# Patient Record
Sex: Female | Born: 1975 | Hispanic: Yes | Marital: Single | Smoking: Never smoker
Health system: Southern US, Community
[De-identification: ages and names within clinical notes are randomized; demographics above are authoritative.]

## PROBLEM LIST (undated history)

## (undated) DIAGNOSIS — E119 Type 2 diabetes mellitus without complications: Secondary | ICD-10-CM

## (undated) DIAGNOSIS — A6000 Herpesviral infection of urogenital system, unspecified: Secondary | ICD-10-CM

## (undated) DIAGNOSIS — O09529 Supervision of elderly multigravida, unspecified trimester: Secondary | ICD-10-CM

## (undated) DIAGNOSIS — N879 Dysplasia of cervix uteri, unspecified: Secondary | ICD-10-CM

## (undated) HISTORY — PX: LEEP: SHX91

---

## 2008-03-07 ENCOUNTER — Inpatient Hospital Stay (HOSPITAL_COMMUNITY): Admission: AD | Admit: 2008-03-07 | Discharge: 2008-03-08 | Payer: Self-pay | Admitting: Obstetrics & Gynecology

## 2008-03-22 ENCOUNTER — Inpatient Hospital Stay (HOSPITAL_COMMUNITY): Admission: RE | Admit: 2008-03-22 | Discharge: 2008-03-22 | Payer: Self-pay | Admitting: Obstetrics & Gynecology

## 2008-11-11 DIAGNOSIS — O321XX0 Maternal care for breech presentation, not applicable or unspecified: Secondary | ICD-10-CM

## 2009-09-30 IMAGING — US US OB COMP LESS 14 WK
1 series · 14 of 28 positions shown · non-contrast
Comparison: none

CLINICAL DATA: Early pregnancy.  Pelvic pain.

OBSTETRIC <14 WK US AND TRANSVAGINAL OB US
TECHNIQUE: Both transabdominal and transvaginal ultrasound
examinations were performed for complete evaluation of the
gestation as well as the maternal uterus, adnexal regions, and
pelvic cul-de-sac.

[Series 1: us ob comp less 14 wk · 0.28mm/px · 14 of 49 slices shown]
[im 2/49]
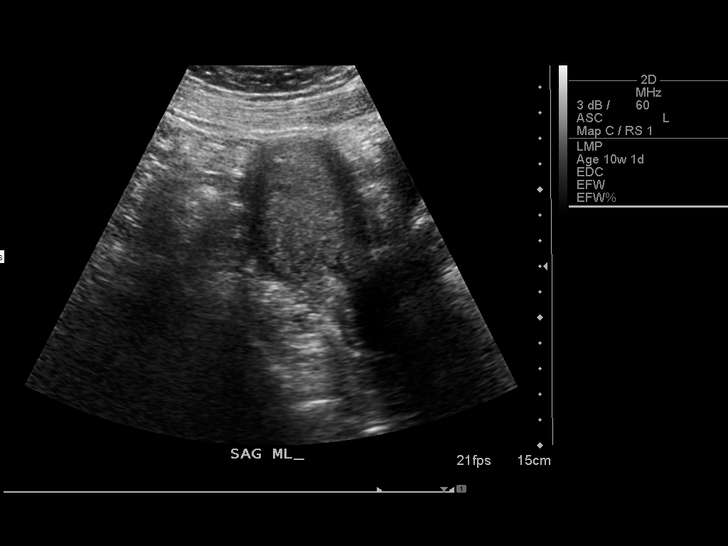
[im 6/49]
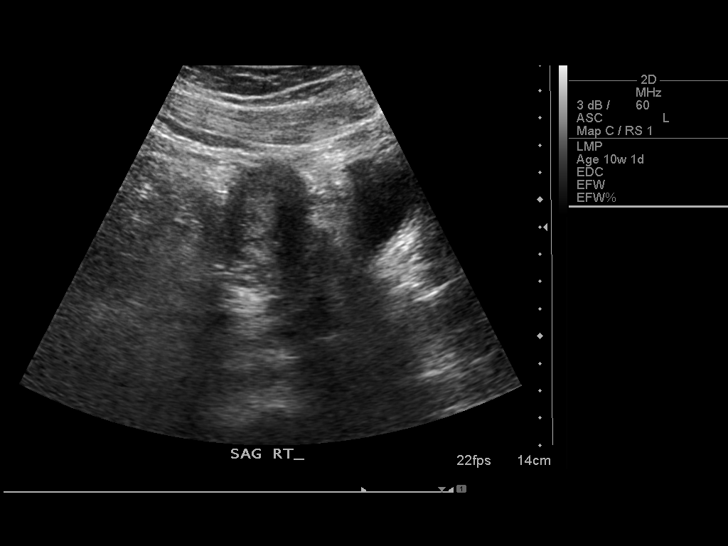
[im 9/49]
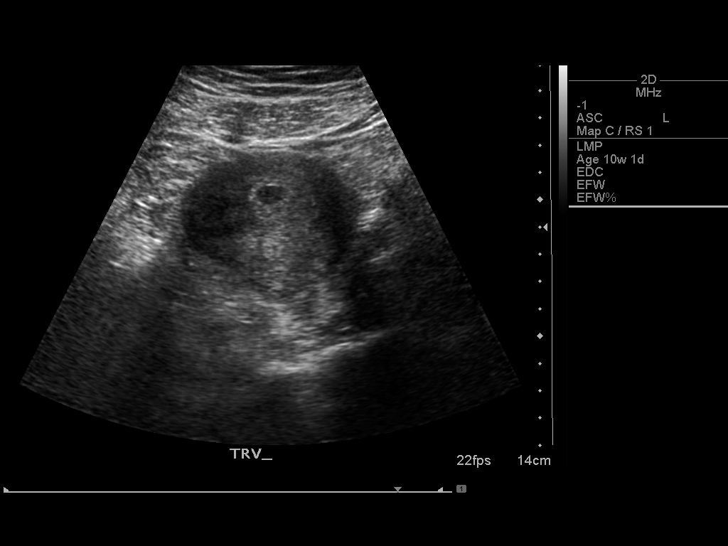
[im 13/49]
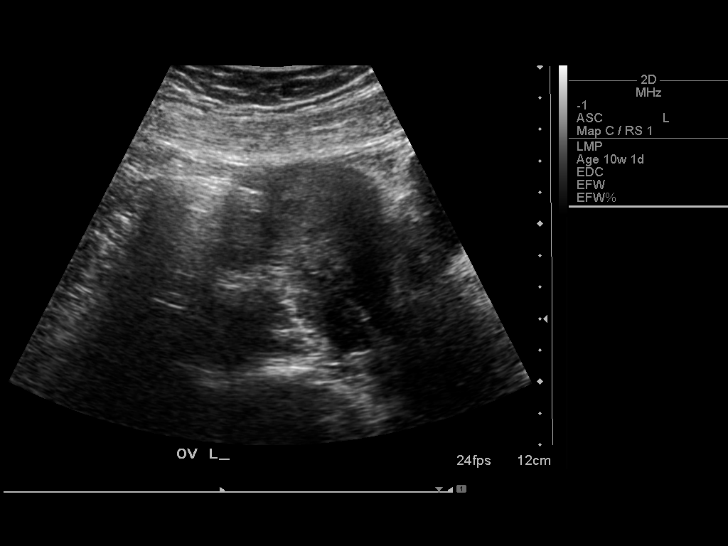
[im 17/49]
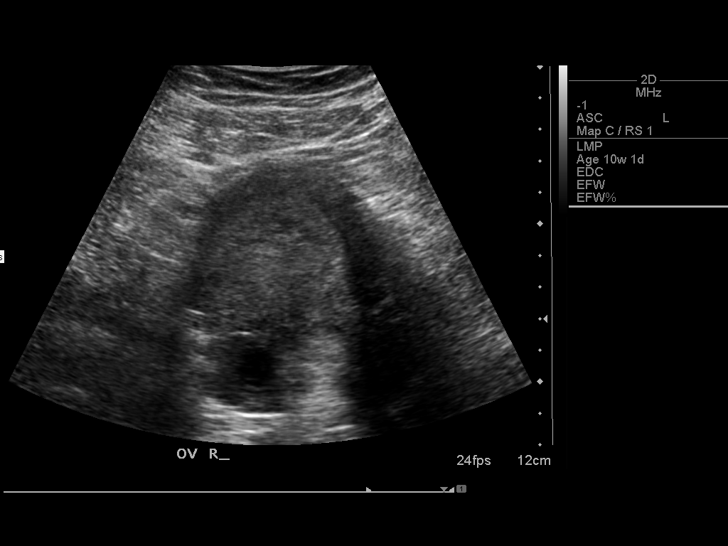
[im 20/49]
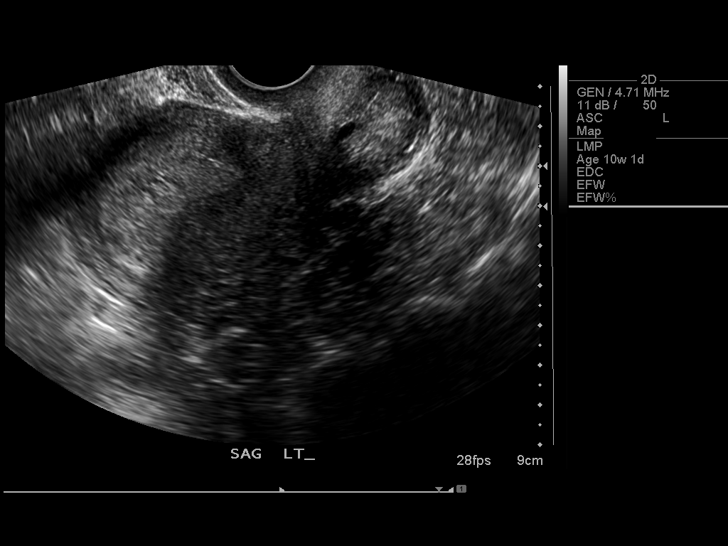
[im 24/49]
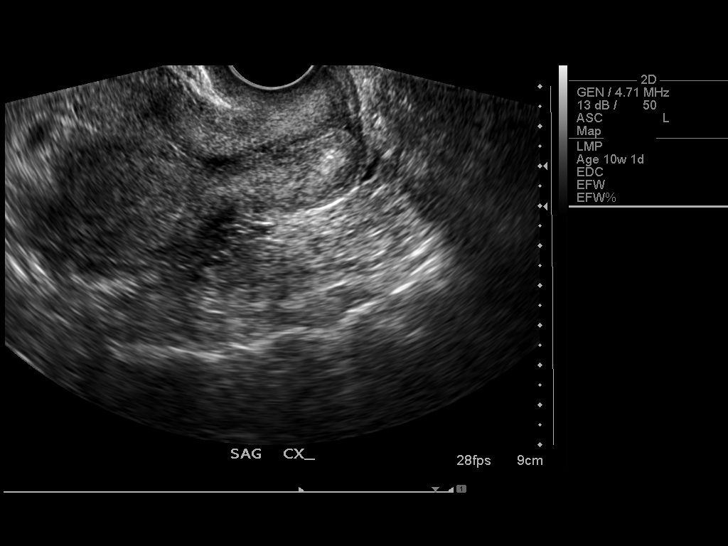
[im 27/49]
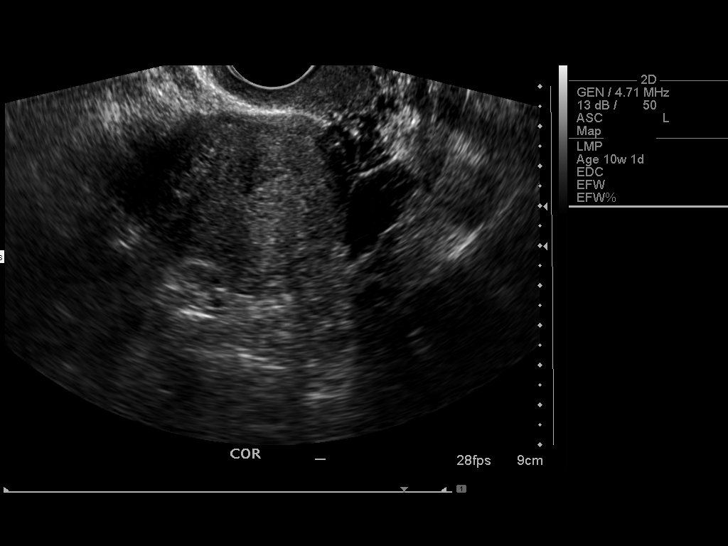
[im 31/49]
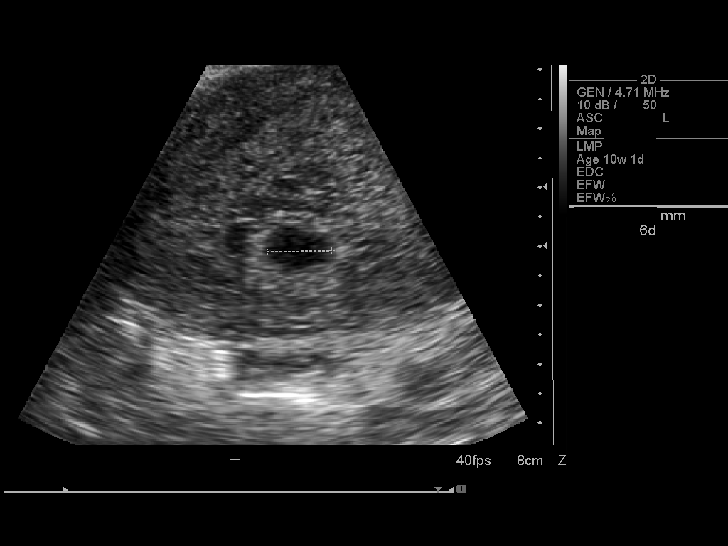
[im 34/49]
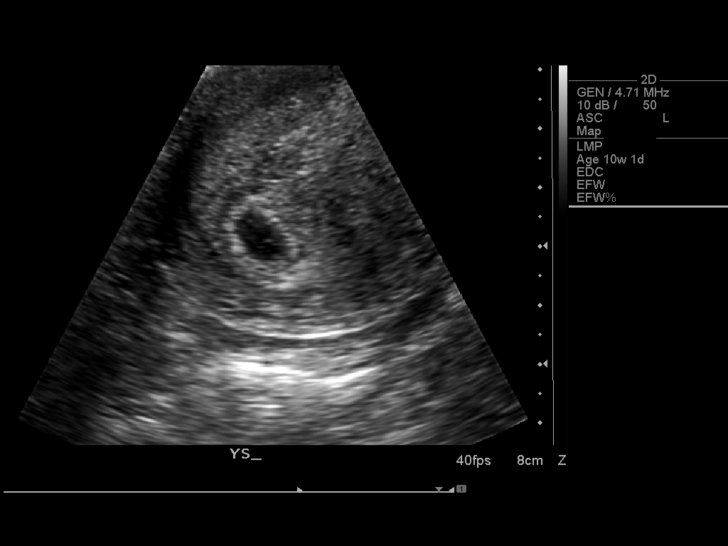
[im 38/49]
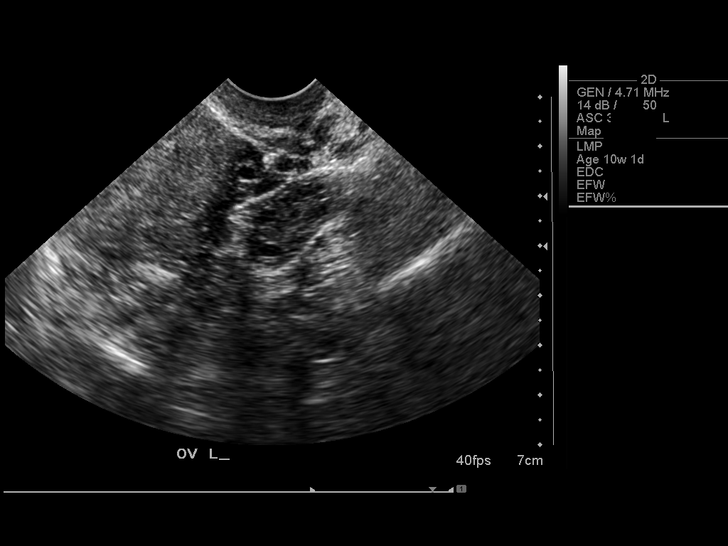
[im 41/49]
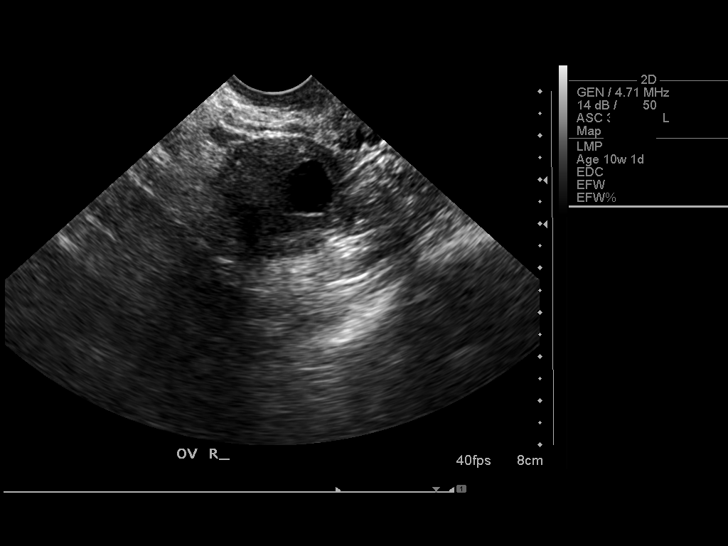
[im 45/49]
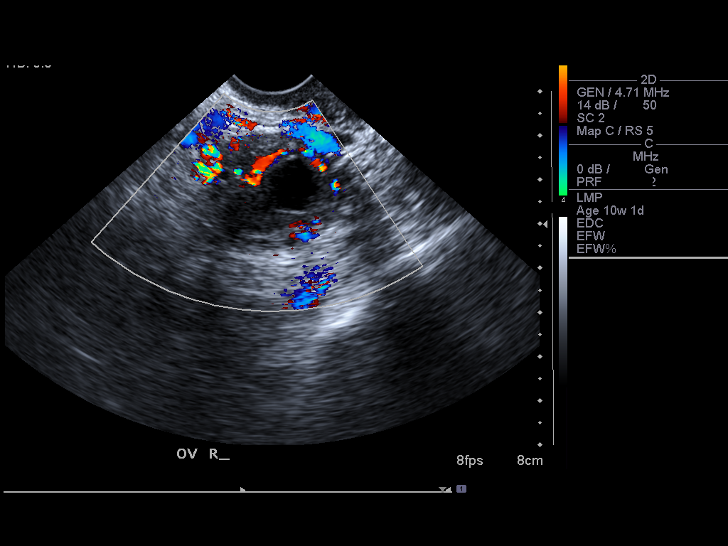
[im 49/49]
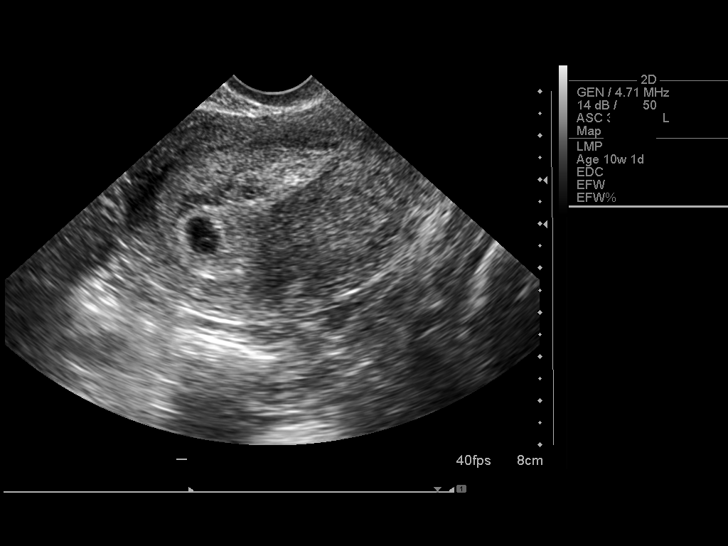

[14 of 28 positions shown; findings below may reference images not displayed]

FINDINGS: An intrauterine gestational sac is identified with mean
sac diameter of 9.3 mm compatible with 5 weeks 5 days gestation.

An internal yolk sac is identified.  No embryo is currently seen.

No subchorionic hemorrhage is evident.

The left ovary appears normal.  The right ovary contains a 1.2 cm
corpus luteum cyst.

No free pelvic fluid is identified.
IMPRESSION: 1.  Single intrauterine gestational sac with internal yolk sac.
The embryo is not yet seen, and the sac measures at 5 weeks 5 days.
Careful correlation with quantitative beta HCG trend is suggested.

## 2009-10-15 IMAGING — US US OB TRANSVAGINAL
1 series · 14 of 28 positions shown · non-contrast
Comparison: none

OBSTETRICAL ULTRASOUND:
 This ultrasound exam was performed in the [HOSPITAL] Ultrasound Department.  The OB US report was generated in the AS system, and faxed to the ordering physician.  This report is also available in [REDACTED] PACS.

[Series 1: us ob transvaginal · 14 of 51 slices shown]
[im 2/51]
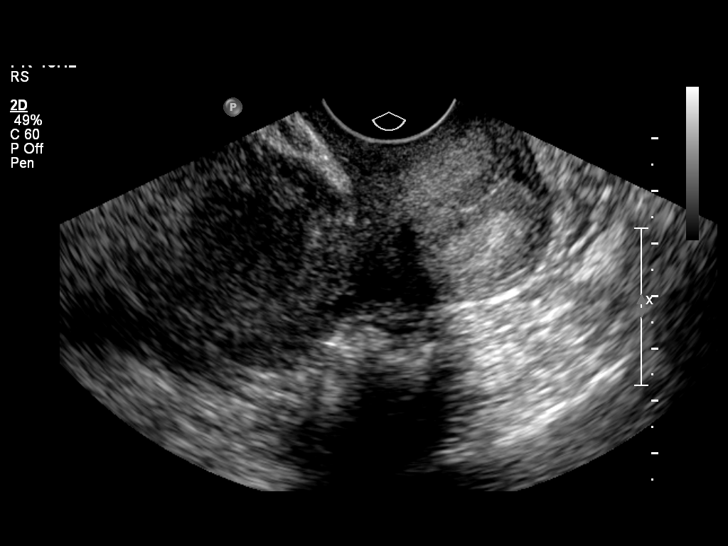
[im 6/51]
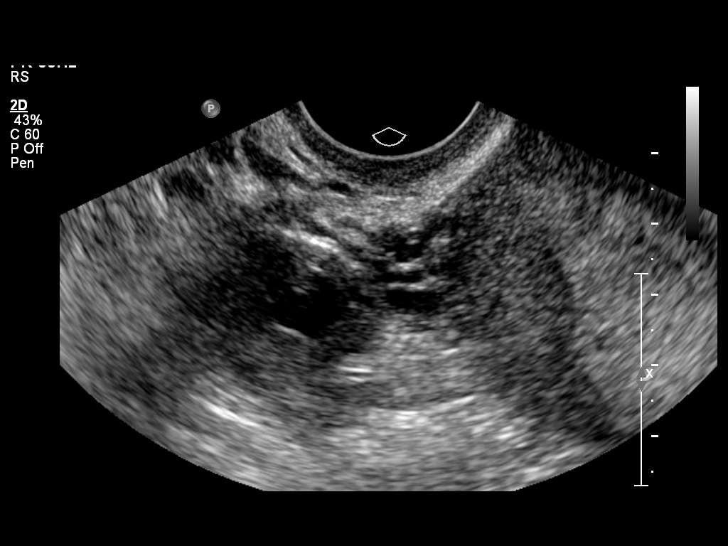
[im 10/51]
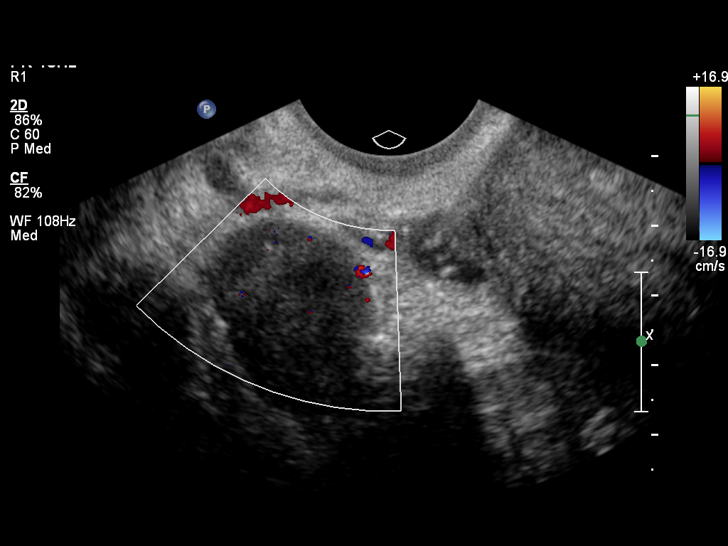
[im 13/51]
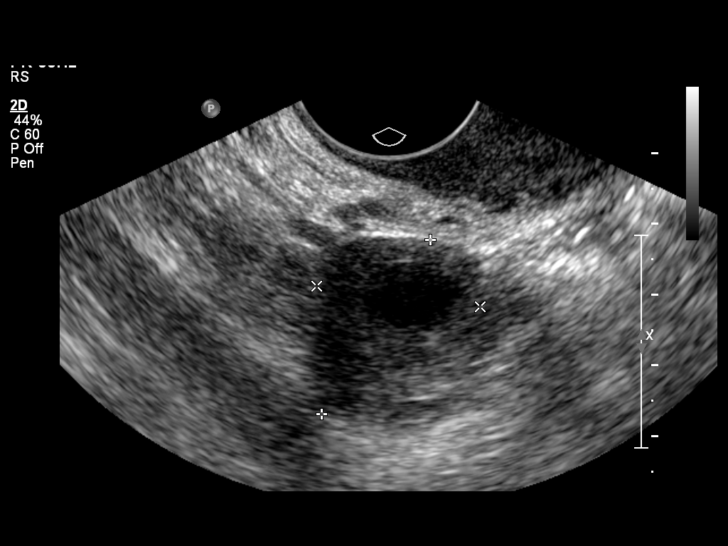
[im 17/51]
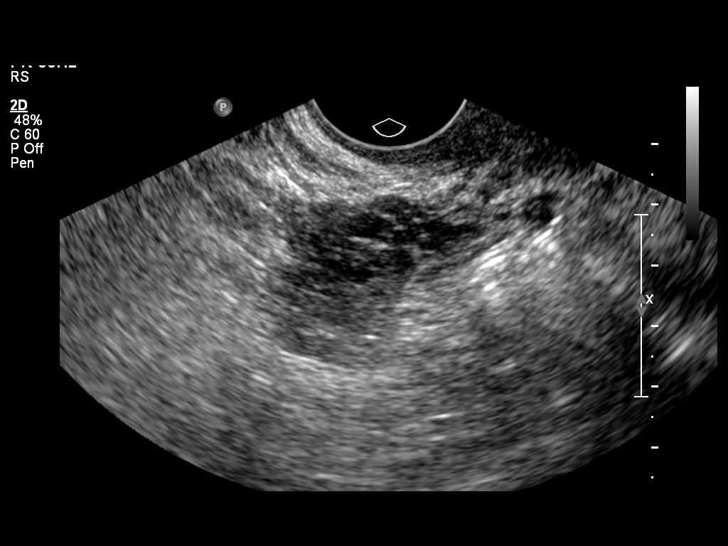
[im 21/51]
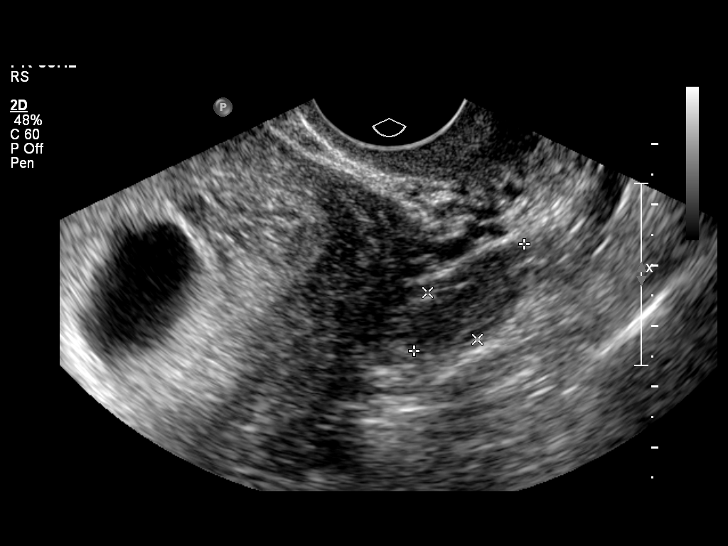
[im 25/51]
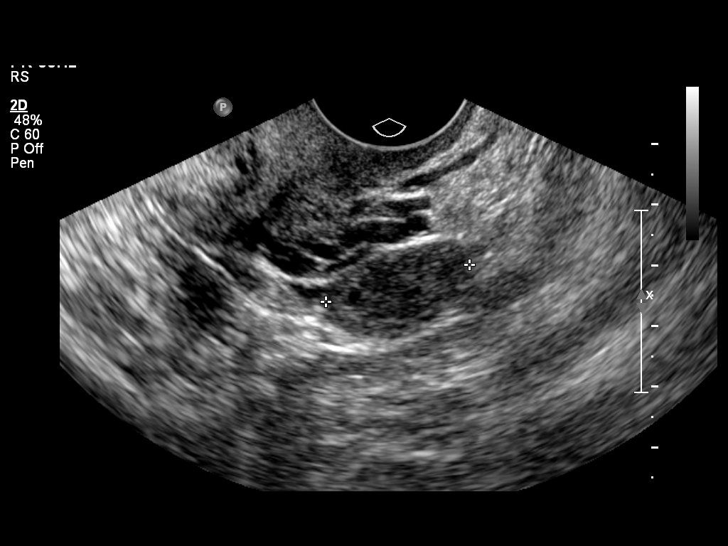
[im 28/51]
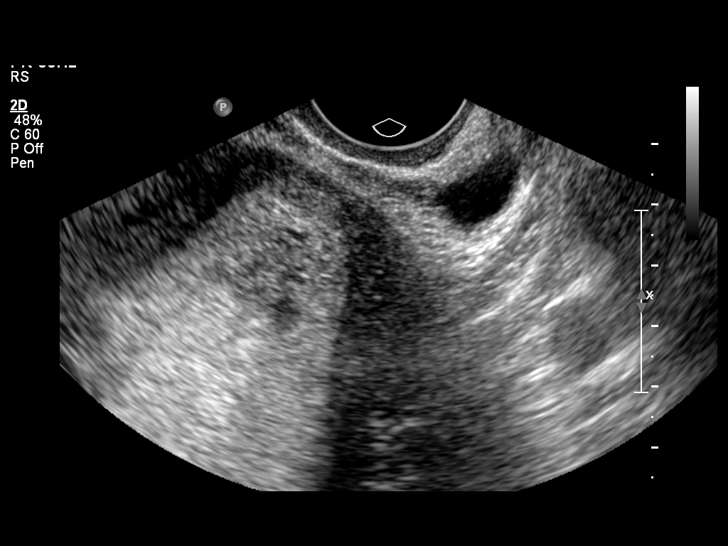
[im 32/51]
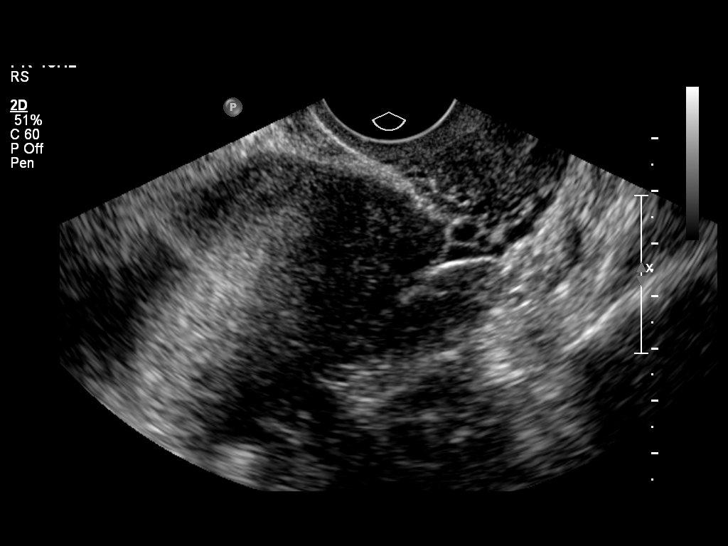
[im 36/51]
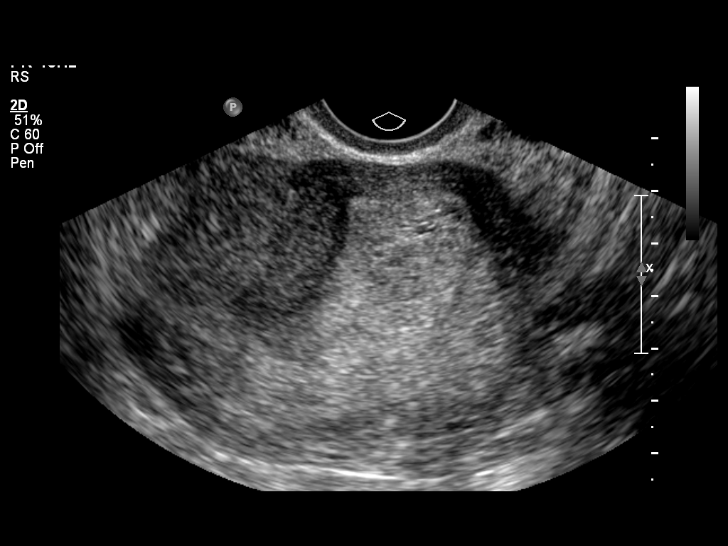
[im 39/51]
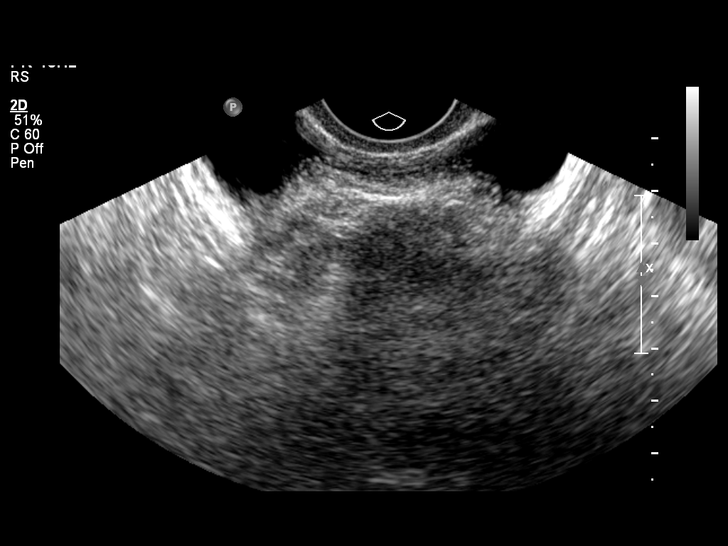
[im 43/51]
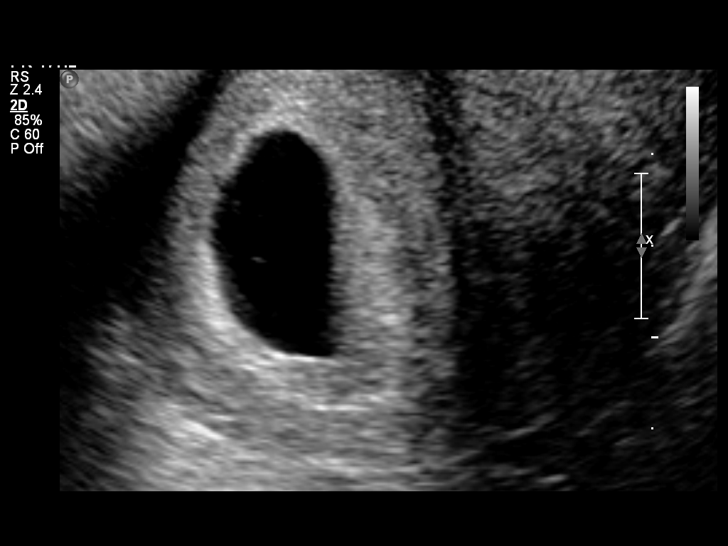
[im 47/51]
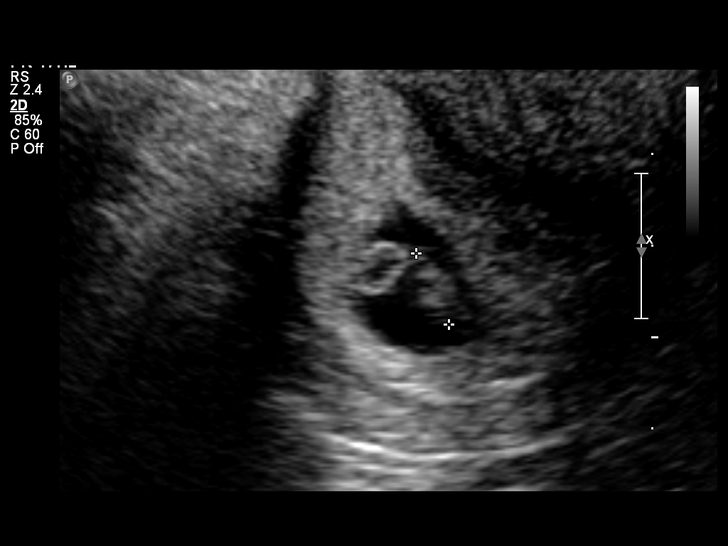
[im 51/51]
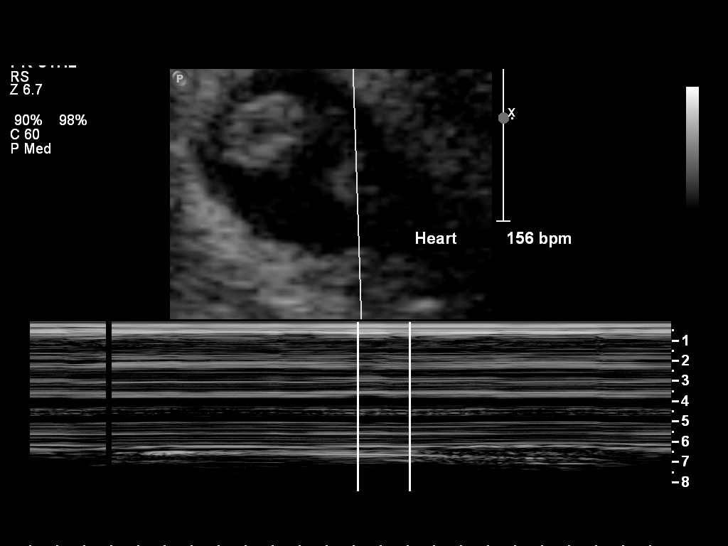

[14 of 28 positions shown; findings below may reference images not displayed]

IMPRESSION: See AS Obstetric US report.

## 2011-02-24 LAB — URINALYSIS, ROUTINE W REFLEX MICROSCOPIC
Glucose, UA: NEGATIVE
Protein, ur: NEGATIVE

## 2011-02-24 LAB — WET PREP, GENITAL

## 2011-02-24 LAB — HCG, QUANTITATIVE, PREGNANCY: hCG, Beta Chain, Quant, S: 12558 — ABNORMAL HIGH

## 2011-02-24 LAB — POCT PREGNANCY, URINE: Preg Test, Ur: POSITIVE

## 2012-06-02 ENCOUNTER — Ambulatory Visit: Payer: Self-pay

## 2012-07-21 ENCOUNTER — Emergency Department (HOSPITAL_COMMUNITY)
Admission: EM | Admit: 2012-07-21 | Discharge: 2012-07-21 | Discharge status: Against Medical Advice | Payer: Self-pay | Source: Home / Self Care | Attending: Emergency Medicine | Admitting: Emergency Medicine

## 2012-07-21 ENCOUNTER — Encounter (HOSPITAL_COMMUNITY): Payer: Self-pay

## 2012-07-21 ENCOUNTER — Emergency Department (INDEPENDENT_AMBULATORY_CARE_PROVIDER_SITE_OTHER)
Admission: EM | Admit: 2012-07-21 | Discharge: 2012-07-21 | Disposition: A | Discharge status: Home / Self Care | Payer: Self-pay | Source: Home / Self Care

## 2012-07-21 DIAGNOSIS — R51 Headache: Secondary | ICD-10-CM

## 2012-07-21 LAB — CBC
HCT: 40 % (ref 36.0–46.0)
MCH: 30.4 pg (ref 26.0–34.0)
MCHC: 35 g/dL (ref 30.0–36.0)
MCV: 87 fL (ref 78.0–100.0)

## 2012-07-21 NOTE — ED Provider Notes (Signed)
Patient Demographics  Jean Aguirre, is a 37 y.o. female  RUE:[id number]  JYN:[id number]  DOB - 03/09/1976  Chief Complaint  Patient presents with  . Hypertension        Subjective:   Jean Aguirre this is Spanish speaking female, and the history is obtained through an interpreter. 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 She initially thought it was related to her blood pressure, so her BP was checked and came back within normal limits at  118/63. She does not have any history of hypertension.She denies any photophobia phonophobia. She admits to an associated bitter taste in her mouth, but no vomiting. She also reports that she felt slightly dizzy. Patient states that she gets this headaches usually justbefore her periods or towards the end of her periods.today has, No headache, No chest pain, No abdominal pain - No Nausea, No new weakness tingling or numbness, No Cough - SOB. She denies dysuria no fevers.she states that after she took Advil for headache resolved.  Objective:    Filed Vitals:   07/21/12 1209  BP: 118/63  Pulse: 70  Temp: 98 F (36.7 C)  TempSrc: Oral  SpO2: 98%     Exam  Awake Alert, Oriented X 3, No new F.N deficits, Normal affect [place].AT,PERRAL Supple Neck,No JVD, No cervical lymphadenopathy appriciated.  Symmetrical Chest wall movement, Good air movement bilaterally, CTAB RRR,No Gallops,Rubs or new Murmurs, No Parasternal Heave +ve B.Sounds, Abd Soft, Non tender, No organomegaly appriciated, No rebound - guarding or rigidity. No Cyanosis, Clubbing or edema, No new Rash or bruise      Data Review   CBC No results found for this basename: WBC, HGB, HCT, PLT, MCV, MCH, MCHC, RDW, NEUTRABS, LYMPHSABS, MONOABS, EOSABS, BASOSABS, BANDABS, BANDSABD,  in the last 168 hours  Chemistries   No results found for this basename: NA, K, CL, CO2, GLUCOSE,  BUN, CREATININE, GFRCGP, CALCIUM, MG, AST, ALT, ALKPHOS, BILITOT,  in the last 168 hours ------------------------------------------------------------------------------------------------------------------ No results found for this basename: HGBA1C,  in the last 72 hours ------------------------------------------------------------------------------------------------------------------ No results found for this basename: CHOL, HDL, LDLCALC, TRIG, CHOLHDL, LDLDIRECT,  in the last 72 hours ------------------------------------------------------------------------------------------------------------------ No results found for this basename: TSH, T4TOTAL, FREET3, T3FREE, THYROIDAB,  in the last 72 hours ------------------------------------------------------------------------------------------------------------------ No results found for this basename: VITAMINB12, FOLATE, FERRITIN, TIBC, IRON, RETICCTPCT,  in the last 72 hours  Coagulation profile  No results found for this basename: INR, PROTIME,  in the last 168 hours     Prior to Admission medications   Not on File     Assessment & Plan   Headache, menstrual period Associated -as discussed above, I have recommended her chills/ibuprofen Peri menstrually she got good relief with this today. -patient to follow up in a month, if persisting recommend trial of a diuretic which could be helpful in this setting.    Follow-up Information   Follow up In 1 month.       Denver Faster.D on 07/21/2012 at 1:10 PM   Kela Millin, MD 07/21/12 1319

## 2012-07-21 NOTE — ED Notes (Signed)
Jean Aguirre called patient at 1133 still no response

## 2012-07-21 NOTE — ED Notes (Signed)
Patient states in the am she wakes up with headaches

## 2012-07-21 NOTE — ED Notes (Signed)
Went to get patient, called inside lobby, went outside, no response

## 2012-09-06 ENCOUNTER — Emergency Department (HOSPITAL_COMMUNITY)
Admission: EM | Admit: 2012-09-06 | Discharge: 2012-09-06 | Disposition: A | Discharge status: Home Health | Payer: No Typology Code available for payment source | Source: Home / Self Care

## 2012-09-06 ENCOUNTER — Encounter (HOSPITAL_COMMUNITY): Payer: Self-pay

## 2012-09-06 DIAGNOSIS — K0889 Other specified disorders of teeth and supporting structures: Secondary | ICD-10-CM

## 2012-09-06 DIAGNOSIS — K089 Disorder of teeth and supporting structures, unspecified: Secondary | ICD-10-CM

## 2012-09-06 NOTE — ED Provider Notes (Signed)
History     CSN: [id number]  Arrival date & time 09/06/12  1235   First MD Initiated Contact with Patient 09/06/12 1245      Chief Complaint  Patient presents with  . Dental Pain    (Consider location/radiation/quality/duration/timing/severity/associated sxs/prior treatment) HPI  Patient is 37 year old female who presents to clinic with me of intermittent episodes of dental pain, nothing specific, dull in etiology, associated with gum swelling, no bleeding or discharge noted, no fractures. Patient denies fevers and chills, no systemic concerns. She reports she feels like she needs cleaning and would like referral to dentist.  History reviewed. No pertinent past medical history.  History reviewed. No pertinent past surgical history.  Family history of HTN   History  Substance Use Topics  . Smoking status: Not on file  . Smokeless tobacco: Not on file  . Alcohol Use: Not on file    OB History   Grav Para Term Preterm Abortions TAB SAB Ect Mult Living                  Review of Systems Constitutional: Negative for fever, chills, diaphoresis, activity change, appetite change and fatigue.  HENT: Negative for ear pain, nosebleeds, congestion, facial swelling, rhinorrhea, neck pain, neck stiffness and ear discharge.   Eyes: Negative for pain, discharge, redness, itching and visual disturbance.  Respiratory: Negative for cough, choking, chest tightness, shortness of breath, wheezing and stridor.   Cardiovascular: Negative for chest pain, palpitations and leg swelling.  Gastrointestinal: Negative for abdominal distention.  Genitourinary: Negative for dysuria, urgency, frequency, hematuria, flank pain, decreased urine volume, difficulty urinating and dyspareunia.  Musculoskeletal: Negative for back pain, joint swelling, arthralgias and gait problem.  Neurological: Negative for dizziness, tremors, seizures, syncope, facial asymmetry, speech difficulty, weakness, light-headedness,  numbness and headaches.  Hematological: Negative for adenopathy. Does not bruise/bleed easily.  Psychiatric/Behavioral: Negative for hallucinations, behavioral problems, confusion, dysphoric mood, decreased concentration and agitation.    Allergies  Review of patient's allergies indicates no known allergies.  Home Medications  No current outpatient prescriptions on file.  Pulse 70  Temp(Src) 97.7 F (36.5 C) (Oral)  Resp 18  SpO2 100%  Physical Exam Constitutional: Appears well-developed and well-nourished. No distress.  HENT: Normocephalic. External right and left ear normal. Oropharynx is clear and moist.  Eyes: Conjunctivae and EOM are normal. PERRLA, no scleral icterus.  Neck: Normal ROM. Neck supple. No JVD. No tracheal deviation. No thyromegaly.  CVS: RRR, S1/S2 +, no murmurs, no gallops, no carotid bruit.  Pulmonary: Effort and breath sounds normal, no stridor, rhonchi, wheezes, rales.  Abdominal: Soft. BS +,  no distension, tenderness, rebound or guarding.  Musculoskeletal: Normal range of motion. No edema and no tenderness.  Lymphadenopathy: No lymphadenopathy noted, cervical, inguinal. Neuro: Alert. Normal reflexes, muscle tone coordination. No cranial nerve deficit. Skin: Skin is warm and dry. No rash noted. Not diaphoretic. No erythema. No pallor.  Psychiatric: Normal mood and affect. Behavior, judgment, thought content normal.    ED Course  Procedures (including critical care time)  Labs Reviewed - No data to display No results found.   1. Pain, dental    - No acute findings on the exam except several cavities in molar teeth upper and lower - Will provide referral to dentist for further evaluation   MDM  Dental pain, referral to dentist        Dorothea Ogle, MD 09/06/12 1258

## 2012-09-06 NOTE — ED Notes (Signed)
Patient states has been having tooth pain for a couple of months Needs dental referral

## 2012-09-07 NOTE — ED Notes (Signed)
Referral  Faxed [facility] -waiting for an appt

## 2013-07-31 ENCOUNTER — Encounter (HOSPITAL_COMMUNITY): Payer: Self-pay | Admitting: Emergency Medicine

## 2013-07-31 ENCOUNTER — Emergency Department (HOSPITAL_COMMUNITY): Payer: Self-pay

## 2013-07-31 ENCOUNTER — Emergency Department (HOSPITAL_COMMUNITY)
Admission: EM | Admit: 2013-07-31 | Discharge: 2013-07-31 | Disposition: A | Discharge status: Home / Self Care | Payer: Self-pay | Attending: Emergency Medicine | Admitting: Emergency Medicine

## 2013-07-31 DIAGNOSIS — O039 Complete or unspecified spontaneous abortion without complication: Secondary | ICD-10-CM | POA: Insufficient documentation

## 2013-07-31 LAB — CBC WITH DIFFERENTIAL/PLATELET
BASOS ABS: 0 10*3/uL (ref 0.0–0.1)
Basophils Relative: 0 % (ref 0–1)
Eosinophils Absolute: 0.2 10*3/uL (ref 0.0–0.7)
Eosinophils Relative: 3 % (ref 0–5)
HEMATOCRIT: 40 % (ref 36.0–46.0)
HEMOGLOBIN: 13.8 g/dL (ref 12.0–15.0)
LYMPHS ABS: 3.3 10*3/uL (ref 0.7–4.0)
LYMPHS PCT: 45 % (ref 12–46)
MCH: 30.7 pg (ref 26.0–34.0)
MCHC: 34.5 g/dL (ref 30.0–36.0)
MCV: 89.1 fL (ref 78.0–100.0)
MONOS PCT: 7 % (ref 3–12)
Monocytes Absolute: 0.5 10*3/uL (ref 0.1–1.0)
NEUTROS ABS: 3.3 10*3/uL (ref 1.7–7.7)
NEUTROS PCT: 45 % (ref 43–77)
PLATELETS: 174 10*3/uL (ref 150–400)
RBC: 4.49 MIL/uL (ref 3.87–5.11)
RDW: 12.5 % (ref 11.5–15.5)
WBC: 7.3 10*3/uL (ref 4.0–10.5)

## 2013-07-31 LAB — I-STAT CHEM 8, ED
BUN: 7 mg/dL (ref 6–23)
CALCIUM ION: 1.08 mmol/L — AB (ref 1.12–1.23)
Chloride: 100 mEq/L (ref 96–112)
Creatinine, Ser: 0.7 mg/dL (ref 0.50–1.10)
Glucose, Bld: 101 mg/dL — ABNORMAL HIGH (ref 70–99)
HCT: 38 % (ref 36.0–46.0)
HEMOGLOBIN: 12.9 g/dL (ref 12.0–15.0)
Potassium: 3.7 mEq/L (ref 3.7–5.3)
SODIUM: 140 meq/L (ref 137–147)
TCO2: 25 mmol/L (ref 0–100)

## 2013-07-31 LAB — WET PREP, GENITAL
CLUE CELLS WET PREP: NONE SEEN
TRICH WET PREP: NONE SEEN
WBC WET PREP: NONE SEEN
Yeast Wet Prep HPF POC: NONE SEEN

## 2013-07-31 LAB — HCG, QUANTITATIVE, PREGNANCY: hCG, Beta Chain, Quant, S: 2411 m[IU]/mL — ABNORMAL HIGH (ref <5)

## 2013-07-31 LAB — GC/CHLAMYDIA PROBE AMP
CT Probe RNA: NEGATIVE
GC PROBE AMP APTIMA: NEGATIVE

## 2013-07-31 LAB — ABO/RH: ABO/RH(D): O POS

## 2013-07-31 MED ORDER — HYDROCODONE-ACETAMINOPHEN 5-325 MG PO TABS: 1.0000 | ORAL_TABLET | Freq: Four times a day (QID) | ORAL | Status: DC | PRN

## 2013-07-31 MED ORDER — PROMETHAZINE HCL 12.5 MG PO TABS: 12.5000 mg | ORAL_TABLET | Freq: Four times a day (QID) | ORAL | Status: DC | PRN

## 2013-07-31 MED ORDER — OXYCODONE-ACETAMINOPHEN 5-325 MG PO TABS
1.0000 | ORAL_TABLET | Freq: Once | ORAL | Status: AC
Start: 2013-07-31 — End: 2013-07-31
  Administered 2013-07-31: 1 via ORAL
  Filled 2013-07-31: qty 1

## 2013-07-31 MED ORDER — ONDANSETRON 4 MG PO TBDP
8.0000 mg | ORAL_TABLET | Freq: Once | ORAL | Status: AC
  Administered 2013-07-31: 8 mg via ORAL
  Filled 2013-07-31: qty 2

## 2013-07-31 MED ORDER — IBUPROFEN 800 MG PO TABS: 800.0000 mg | ORAL_TABLET | Freq: Three times a day (TID) | ORAL | Status: DC

## 2013-07-31 NOTE — ED Notes (Signed)
Per Spanish Int [phone]#[id number] Marjean Donnaileen, patient had a miscarriage tonight about 5pm.  Abd continues to hurt. Bleeding continues

## 2013-07-31 NOTE — ED Provider Notes (Signed)
CSN: [id number]     Arrival date & time 07/31/13  0017 History   First MD Initiated Contact with Patient 07/31/13 0145     Chief Complaint  Patient presents with  . Abdominal Pain     (Consider location/radiation/quality/duration/timing/severity/associated sxs/prior Treatment) HPI Jean Aguirre is a 38 y.o. female who presents to Emergency Dept complaining of pelvic pain and vaginal bleeding. Pt states she is approximately [redacted]wks pregnant. She does not have an established OB/GYN physician in [place], [facility]. States around 5 p.m. she felt like she passed a mass. Since then she reports vaginal bleeding. She states she has a history of miscarriage in the past. She has 1 living child. Patient denies any fever, chills, urinary symptoms, back pain. She states she took Tylenol for her symptoms which did not help. Patient is Spanish-speaking patient only an interpreter phone used for translation.  History reviewed. No pertinent past medical history. History reviewed. No pertinent past surgical history. History reviewed. No pertinent family history. History  Substance Use Topics  . Smoking status: Never Smoker   . Smokeless tobacco: Not on file  . Alcohol Use: No   OB History   Grav Para Term Preterm Abortions TAB SAB Ect Mult Living                 Review of Systems  Constitutional: Negative for fever and chills.  Respiratory: Negative for cough, chest tightness and shortness of breath.   Cardiovascular: Negative for chest pain, palpitations and leg swelling.  Gastrointestinal: Positive for abdominal pain. Negative for nausea, vomiting and diarrhea.  Genitourinary: Positive for vaginal bleeding, vaginal pain and pelvic pain. Negative for dysuria, flank pain and vaginal discharge.  Musculoskeletal: Negative for arthralgias, myalgias, neck pain and neck stiffness.  Skin: Negative for rash.  Neurological: Negative for dizziness, weakness and headaches.  All  other systems reviewed and are negative.      Allergies  Review of patient's allergies indicates no known allergies.  Home Medications  No current outpatient prescriptions on file. BP 110/56  Pulse 60  Temp(Src) 98.3 F (36.8 C) (Oral)  Resp 17  SpO2 98% Physical Exam  Nursing note and vitals reviewed. Constitutional: She appears well-developed and well-nourished. No distress.  HENT:  Head: Normocephalic.  Eyes: Conjunctivae are normal.  Neck: Neck supple.  Cardiovascular: Normal rate, regular rhythm and normal heart sounds.   Pulmonary/Chest: Effort normal and breath sounds normal. No respiratory distress. She has no wheezes. She has no rales.  Abdominal: Soft. Bowel sounds are normal. She exhibits no distension. There is tenderness. There is no rebound.  Suprapubic tenderness  Genitourinary:  Normal external genitalia. Blood and small blood clots in vaginal canal. Cervix is closed. Positive CMT and uterine tenderness. Uterus is gravid  Musculoskeletal: She exhibits no edema.  Neurological: She is alert.  Skin: Skin is warm and dry.  Psychiatric: She has a normal mood and affect. Her behavior is normal.    ED Course  Procedures (including critical care time) Labs Review Labs Reviewed  HCG, QUANTITATIVE, PREGNANCY - Abnormal; Notable for the following:    hCG, Beta Chain, Quant, S 2411 (*)    All other components within normal limits  I-STAT CHEM 8, ED - Abnormal; Notable for the following:    Glucose, Bld 101 (*)    Calcium, Ion 1.08 (*)    All other components within normal limits  WET PREP, GENITAL  GC/CHLAMYDIA PROBE AMP  CBC WITH DIFFERENTIAL  ABO/RH  Imaging Review US Ob Comp Less 14 Wks  07/31/2013   CLINICAL DATA:  Abdominal pain  EXAM: OBSTETRIC <14 WK [place] AND TRANSVAGINAL OB US  TECHNIQUE: Both transabdominal and transvaginal ultrasound examinations were performed for complete evaluation of the gestation as well as the maternal uterus, adnexal regions,  and pelvic cul-de-sac. Transvaginal technique was performed to assess early pregnancy.  COMPARISON:  None.  FINDINGS: Intrauterine gestational sac: Not identified  Yolk sac:  Not identified  Embryo:  Not identified  Cardiac Activity: Not applicable  Maternal uterus/adnexae: Normal sonographic appearance to the ovaries. Trace free fluid.  The endometrium is thickened and debris containing, measuring up to 20 mm. No appreciable associated hypervascularity.  IMPRESSION: No intrauterine gestation. Thickened endometrium with debris in the endometrial cavity. Recommend serial quantitative beta HCG follow-up and ultrasound follow up to resolution.   Electronically Signed   By: Jearld Lesch M.D.   On: 07/31/2013 04:06   US Ob Transvaginal  07/31/2013   CLINICAL DATA:  Abdominal pain  EXAM: OBSTETRIC <14 WK [place] AND TRANSVAGINAL OB US  TECHNIQUE: Both transabdominal and transvaginal ultrasound examinations were performed for complete evaluation of the gestation as well as the maternal uterus, adnexal regions, and pelvic cul-de-sac. Transvaginal technique was performed to assess early pregnancy.  COMPARISON:  None.  FINDINGS: Intrauterine gestational sac: Not identified  Yolk sac:  Not identified  Embryo:  Not identified  Cardiac Activity: Not applicable  Maternal uterus/adnexae: Normal sonographic appearance to the ovaries. Trace free fluid.  The endometrium is thickened and debris containing, measuring up to 20 mm. No appreciable associated hypervascularity.  IMPRESSION: No intrauterine gestation. Thickened endometrium with debris in the endometrial cavity. Recommend serial quantitative beta HCG follow-up and ultrasound follow up to resolution.   Electronically Signed   By: Jearld Lesch M.D.   On: 07/31/2013 04:06     EKG Interpretation None      MDM   Final diagnoses:  Miscarriage    Patient with reported miscarriage around 5 PM tonight. She reports lower abdominal pain and vaginal bleeding. She has  history of similar miscarriages. Will get pelvic exam, labs, ultrasound.  5:12 AM   patient's had a pelvic exam showed closed cervix with vaginal bleeding and small clots. Her blood work is unremarkable. Wet prep normal. Ultrasound showed no intrauterine gestation, thickened endometrium with degrees in the endometrial cavity. This is most consistent with miscarriage. Given this finding, patient will need close followup for hCG recheck and ultrasound. Patient has an appointment with her OB/GYN in 2 days. Patient is followed by [facility]. Patient instructed to go to [facility] if unable to go to [facility]. IUs interpreter phone to discuss the plan and to give instructions for followup. Patient did voice understanding. She will be discharged home with Norco for pain, and had and Phenergan for nausea.    Filed Vitals:   07/31/13 0215 07/31/13 0230 07/31/13 0244 07/31/13 0245  BP: 110/56 108/61 108/61 101/55  Pulse: 60 60 57 59  Temp:      TempSrc:      Resp: 17 18 12 21   SpO2: 98% 99% 97% 97%     Lottie Mussel, PA-C 07/31/13 1308

## 2013-07-31 NOTE — Discharge Instructions (Signed)
Take ibuprofen as prescribed for pain. Norco for severe pain. Phenergan for nausea. Follow up with your OB/GYN on Tuesday [facility] your hcg level and [place]. Your HCG level today is 2411.    Aborto espontneo  (Miscarriage) El aborto espontneo es la prdida de un beb que no ha nacido (feto) antes de la semana 20 del Psychiatrist. La mayor parte de estos abortos ocurre en los primeros 3 meses. En algunos casos ocurre antes de que la mujer sepa que est [place]. Tambin se denomina "aborto espontneo" o "prdida prematura del embarazo". El aborto espontneo puede ser Neomia Dear experiencia que afecte emocionalmente a Dealer. Converse con su mdico si tiene dudas, cmo es el proceso de [place], y sobre planes futuros de Psychiatrist.  CAUSAS   Algunos problemas cromosmicos pueden hacer imposible que el beb se desarrolle normalmente. Los problemas con los genes o cromosomas del beb son generalmente el resultado de errores que se producen, por casualidad, cuando el embrin se divide y crece. Estos problemas no se heredan de los [place].  Infeccin en el cuello del tero.   Problemas hormonales.   Problemas en el cuello del tero, como tener un tero incompetente. Esto ocurre cuando los tejidos no son lo suficientemente fuertes como para Arts administrator.   Problemas del tero, como un tero con forma anormal, los fibromas o anormalidades congnitas.   Ciertas enfermedades crnicas.   No fume, no beba alcohol, ni consuma drogas.   Traumatismos  A veces, la causa es desconocida.  SNTOMAS   Sangrado o manchado vaginal, con o sin clicos o dolor.  Dolor o clicos en el abdomen o en la cintura.  Eliminacin de lquido, tejidos o cogulos grandes por la vagina. DIAGNSTICO  El [employer] har un examen fsico. Tambin le indicar una ecografa para confirmar el aborto. Es posible que se realicen anlisis de [place].  TRATAMIENTO   En algunos casos el tratamiento  no es necesario, si se eliminan naturalmente todos los tejidos embrionarios que se encontraban en el tero. Si el feto o la placenta quedan dentro del tero (aborto incompleto), pueden infectarse, los tejidos que quedan pueden infectarse y deben retirarse. Generalmente se realiza un procedimiento de dilatacin y curetaje (D y C). Durante el procedimiento de dilatacin y curetaje, el cuello del tero se abre (dilata) y se retira cualquier resto de tejido fetal o placentario del tero.  Si hay una infeccin, le recetarn antibiticos. Podrn recetarle otros medicamentos para reducir el tamao del tero (contraerlo) si hay una mucho sangrado.  Si su sangre es Rh negativa y su beb es Rh positivo, usted necesitar la inyeccin de inmunoglobulina Rh. Esta inyeccin proteger a los futuros bebs de tener problemas de compatibilidad Rh en futuros embarazos. INSTRUCCIONES PARA EL CUIDADO EN EL HOGAR   El mdico le indicar reposo en cama o le permitir Dance movement psychotherapist. Vuelva a la actividad lentamente o segn las indicaciones de su mdico.  Pdale a alguien que la ayude con las responsabilidades familiares y del hogar durante este tiempo.   Lleve un registro de la cantidad y la saturacin de las toallas higinicas que Landscape architect. Anote esta informacin   No use tampones. No No se haga duchas vaginales ni tenga relaciones sexuales hasta que el mdico la autorice.   Slo tome medicamentos de venta libre o recetados para Primary school teacher o Environmental health practitioner, segn las indicaciones de su mdico.   No tome aspirina. La aspirina puede ocasionar  hemorragias.   Concurra puntualmente a las citas de control con el mdico.   Si usted o su pareja tienen dificultades con el duelo, hable con su mdico para buscar la Bank of New York Companyayuda psicolgica que los ayude a Runner, broadcasting/film/videoenfrentar la prdida del Psychiatristembarazo. Permtase el tiempo suficiente de duelo antes de quedar embarazada nuevamente.  SOLICITE ATENCIN MDICA DE  INMEDIATO SI:   Siente calambres intensos o dolor en la espalda o en el abdomen.  Tiene fiebre.  Elimina grandes cogulos de [place]avia Hillssangre (del tamao de una nuez o ms) o tejidos por la vagina. Guarde lo que ha eliminado para que su mdico lo examine.   La hemorragia aumenta.   Brett Fairybserva una secrecin vaginal espesa y con mal olor.  Se siente mareada, dbil, o se desmaya.   Siente escalofros.  ASEGRESE DE QUE:   Comprende estas instrucciones.  Controlar su enfermedad.  Solicitar ayuda de inmediato si no mejora o si empeora. Document Released: 02/18/2005 Document Revised: 09/05/2012 [facility] Patient Information 2014 [place] Cliff[place]Care, MarylandLLC.

## 2013-07-31 NOTE — ED Provider Notes (Signed)
Medical screening examination/treatment/procedure(s) were performed by non-physician practitioner and as supervising physician I was immediately available for consultation/collaboration.    Alyss Granato, MD 07/31/13 0716 

## 2014-08-28 LAB — OB RESULTS CONSOLE RUBELLA ANTIBODY, IGM: RUBELLA: IMMUNE

## 2014-08-28 LAB — OB RESULTS CONSOLE ANTIBODY SCREEN: ANTIBODY SCREEN: NEGATIVE

## 2014-08-28 LAB — OB RESULTS CONSOLE HEPATITIS B SURFACE ANTIGEN: Hepatitis B Surface Ag: NEGATIVE

## 2014-08-28 LAB — OB RESULTS CONSOLE HIV ANTIBODY (ROUTINE TESTING): HIV: NONREACTIVE

## 2014-08-28 LAB — OB RESULTS CONSOLE ABO/RH: "RH Type ": POSITIVE

## 2014-08-28 LAB — OB RESULTS CONSOLE VARICELLA ZOSTER ANTIBODY, IGG: Varicella: IMMUNE

## 2015-02-20 ENCOUNTER — Other Ambulatory Visit: Payer: Self-pay | Admitting: Advanced Practice Midwife

## 2015-02-20 DIAGNOSIS — Z3493 Encounter for supervision of normal pregnancy, unspecified, third trimester: Secondary | ICD-10-CM

## 2015-02-21 ENCOUNTER — Ambulatory Visit: Payer: Self-pay

## 2015-02-21 ENCOUNTER — Ambulatory Visit
Admission: RE | Admit: 2015-02-21 | Discharge: 2015-02-21 | Disposition: A | Discharge status: Home / Self Care | Payer: Self-pay | Source: Ambulatory Visit | Attending: Advanced Practice Midwife | Admitting: Advanced Practice Midwife

## 2015-02-21 DIAGNOSIS — Z91410 Personal history of adult physical and sexual abuse: Secondary | ICD-10-CM | POA: Insufficient documentation

## 2015-02-21 DIAGNOSIS — Z3A38 38 weeks gestation of pregnancy: Secondary | ICD-10-CM | POA: Insufficient documentation

## 2015-02-21 DIAGNOSIS — Z3493 Encounter for supervision of normal pregnancy, unspecified, third trimester: Secondary | ICD-10-CM

## 2015-02-21 DIAGNOSIS — R4189 Other symptoms and signs involving cognitive functions and awareness: Secondary | ICD-10-CM | POA: Insufficient documentation

## 2015-02-21 DIAGNOSIS — Z331 Pregnant state, incidental: Secondary | ICD-10-CM | POA: Insufficient documentation

## 2015-02-22 ENCOUNTER — Encounter: Payer: Self-pay | Admitting: *Deleted

## 2015-02-22 ENCOUNTER — Observation Stay
Admission: RE | Admit: 2015-02-22 | Discharge: 2015-02-22 | Disposition: A | Discharge status: Home / Self Care | Payer: Self-pay | Attending: Obstetrics and Gynecology | Admitting: Obstetrics and Gynecology

## 2015-02-22 DIAGNOSIS — O09529 Supervision of elderly multigravida, unspecified trimester: Secondary | ICD-10-CM

## 2015-02-22 DIAGNOSIS — O09523 Supervision of elderly multigravida, third trimester: Principal | ICD-10-CM | POA: Insufficient documentation

## 2015-02-22 DIAGNOSIS — Z3A38 38 weeks gestation of pregnancy: Secondary | ICD-10-CM | POA: Insufficient documentation

## 2015-02-22 NOTE — OB Triage Note (Signed)
[facility], ambulatory in no apparent distress, for scheduled NST due to advance maternal age. Interpreter requested.

## 2015-02-22 NOTE — Procedures (Signed)
FHT: 140, moderate variability, +accels, no decels Toco: none

## 2015-02-23 IMAGING — US US OB TRANSVAGINAL
1 series · 14 of 28 positions shown · non-contrast
Comparison: None.

CLINICAL DATA: Abdominal pain

EXAM:
OBSTETRIC <14 WK US AND TRANSVAGINAL OB US
TECHNIQUE: Both transabdominal and transvaginal ultrasound examinations were
performed for complete evaluation of the gestation as well as the
maternal uterus, adnexal regions, and pelvic cul-de-sac.
Transvaginal technique was performed to assess early pregnancy.

[Series 1: us ob transvaginal · 0.22mm/px · 14 of 53 slices shown]
[im 2/53]
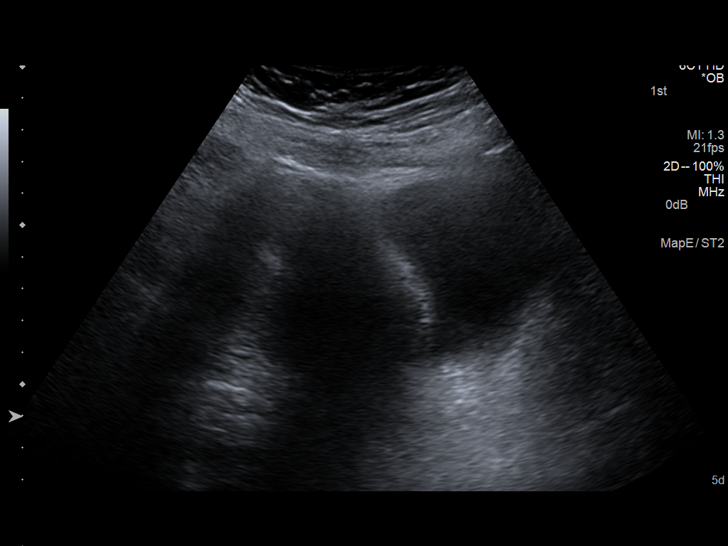
[im 6/53]
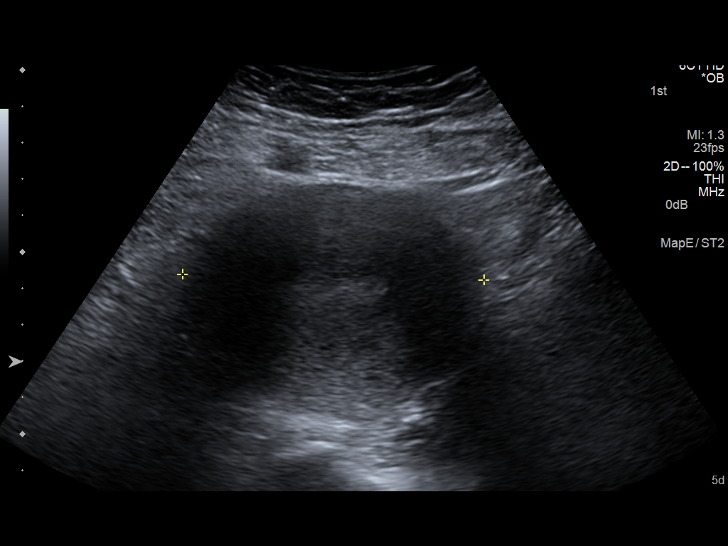
[im 10/53]
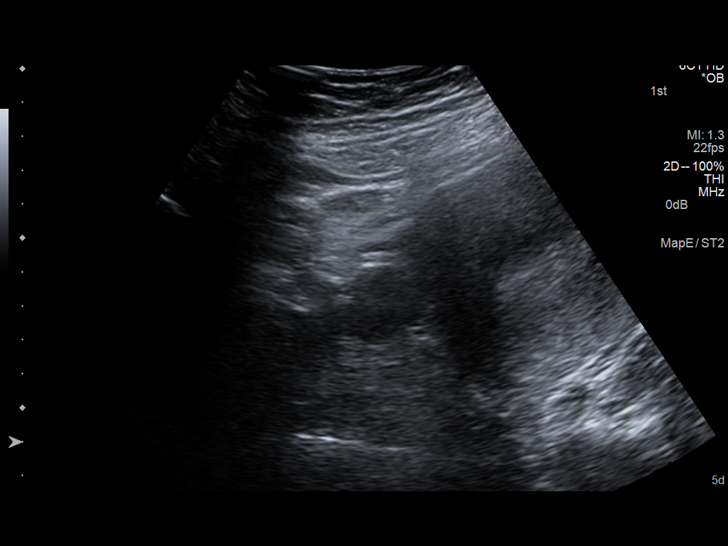
[im 14/53]
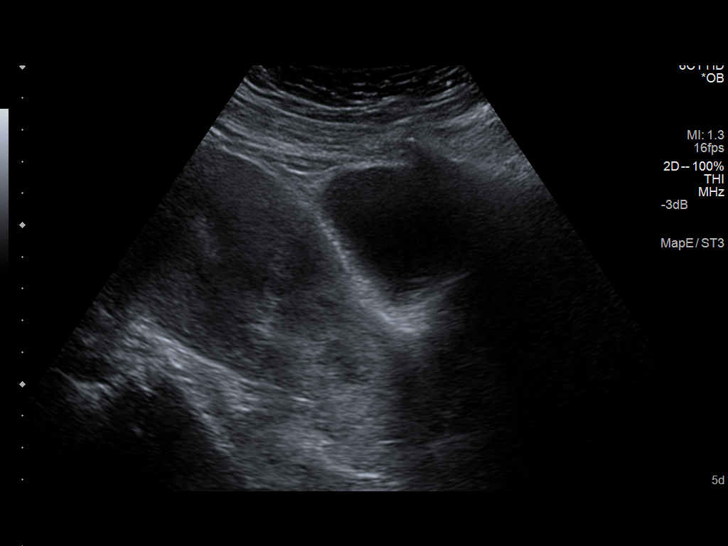
[im 18/53]
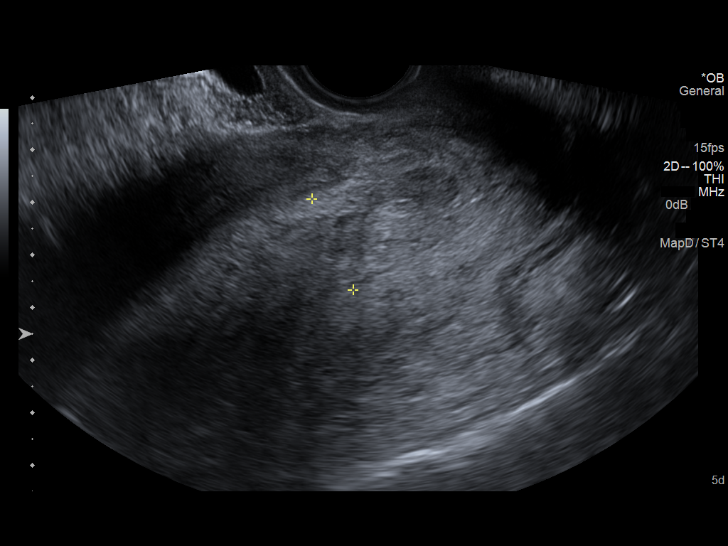
[im 22/53]
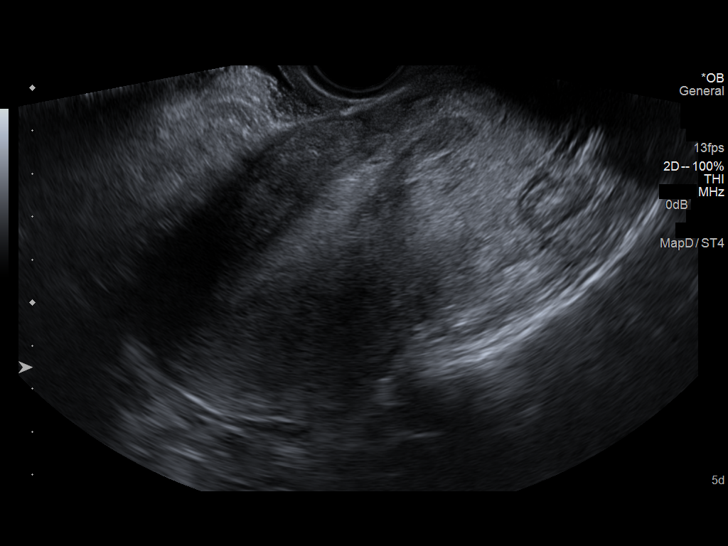
[im 26/53]
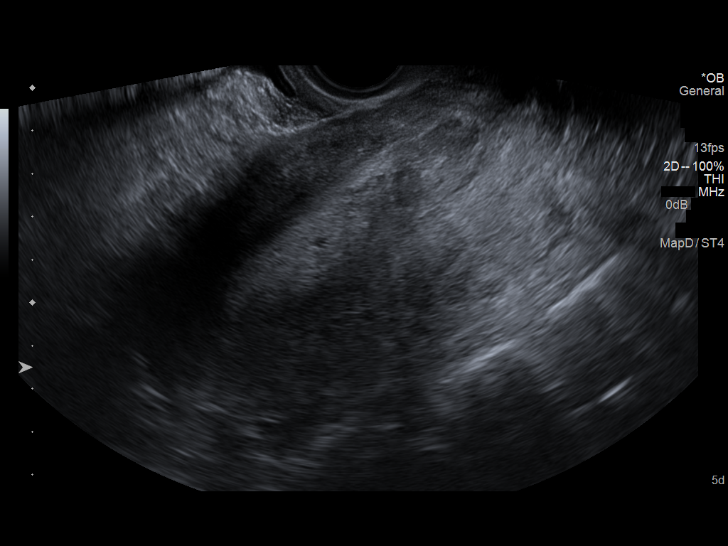
[im 29/53]
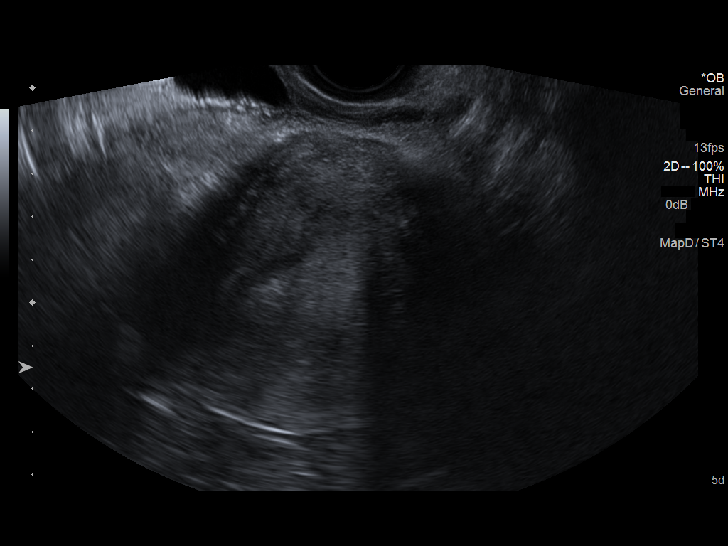
[im 33/53]
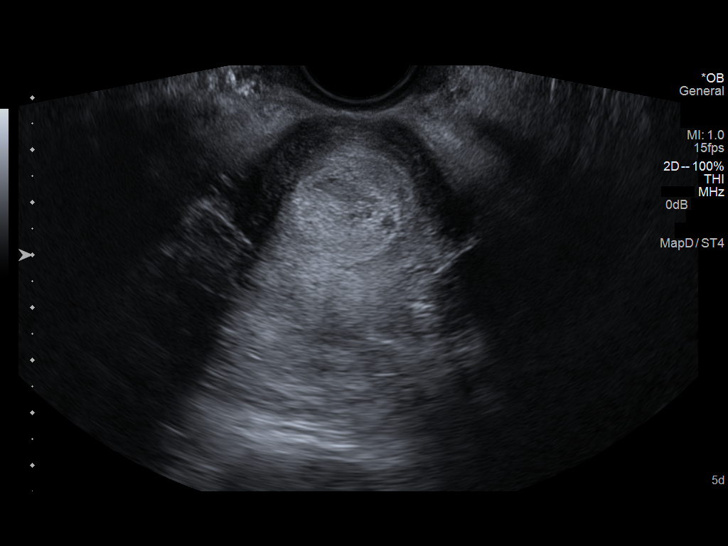
[im 37/53]
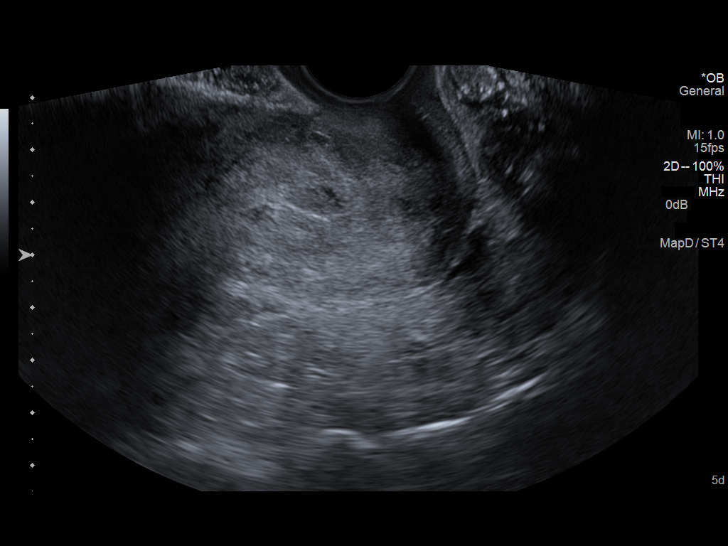
[im 41/53]
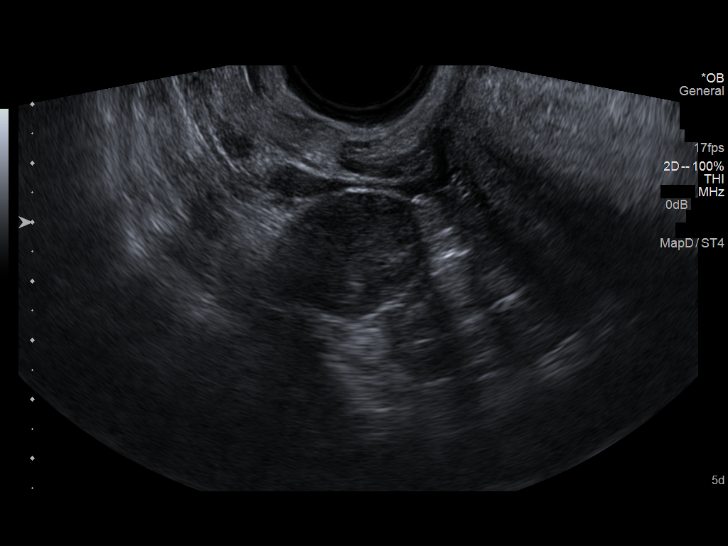
[im 45/53]
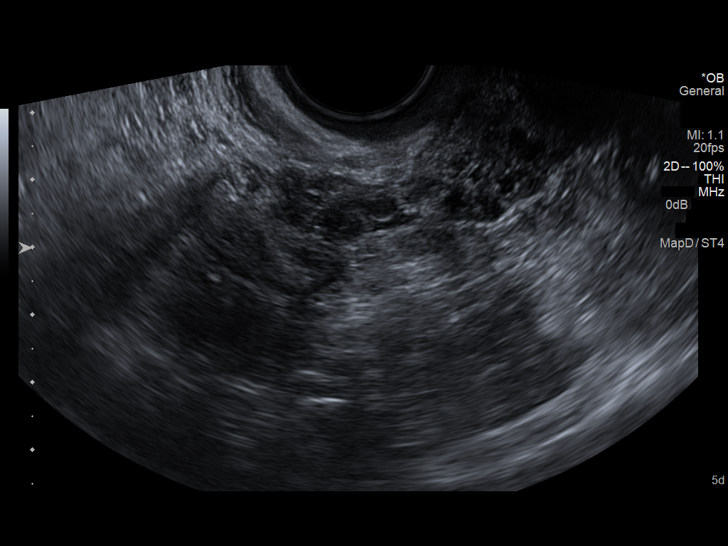
[im 49/53]
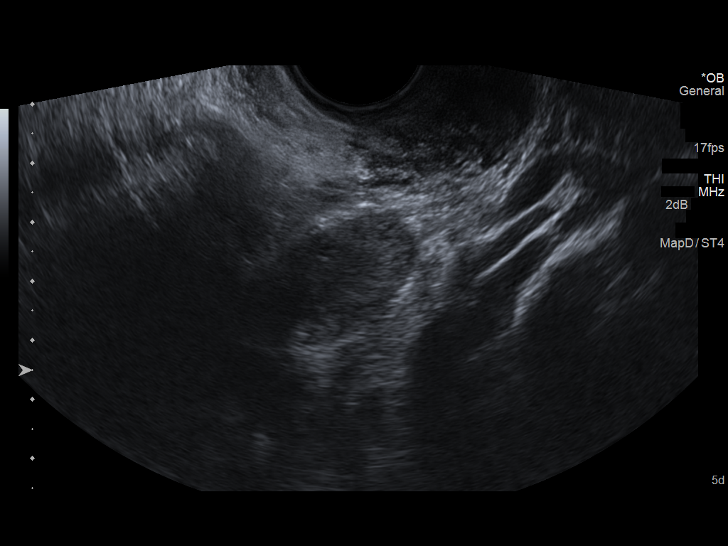
[im 53/53]
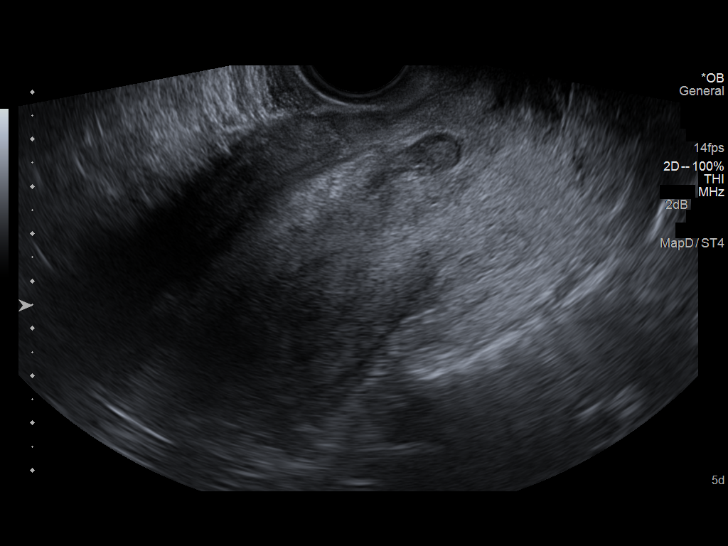

[14 of 28 positions shown; findings below may reference images not displayed]

FINDINGS: Intrauterine gestational sac: Not identified

Yolk sac:  Not identified

Embryo:  Not identified

Cardiac Activity: Not applicable

Maternal uterus/adnexae: Normal sonographic appearance to the
ovaries. Trace free fluid.

The endometrium is thickened and debris containing, measuring up to
20 mm. No appreciable associated hypervascularity.
IMPRESSION: No intrauterine gestation. Thickened endometrium with debris in the
endometrial cavity. Recommend serial quantitative beta HCG follow-up
and ultrasound follow up to resolution.

## 2015-03-01 ENCOUNTER — Observation Stay
Admission: RE | Admit: 2015-03-01 | Discharge: 2015-03-01 | Disposition: A | Discharge status: Home / Self Care | Payer: Self-pay | Source: Ambulatory Visit | Attending: Obstetrics & Gynecology | Admitting: Obstetrics & Gynecology

## 2015-03-01 ENCOUNTER — Encounter: Payer: Self-pay | Admitting: Certified Nurse Midwife

## 2015-03-01 DIAGNOSIS — Z3A39 39 weeks gestation of pregnancy: Secondary | ICD-10-CM | POA: Insufficient documentation

## 2015-03-01 DIAGNOSIS — O34219 Maternal care for unspecified type scar from previous cesarean delivery: Secondary | ICD-10-CM | POA: Diagnosis present

## 2015-03-01 DIAGNOSIS — O09529 Supervision of elderly multigravida, unspecified trimester: Secondary | ICD-10-CM

## 2015-03-01 DIAGNOSIS — O09511 Supervision of elderly primigravida, first trimester: Secondary | ICD-10-CM

## 2015-03-01 DIAGNOSIS — O09523 Supervision of elderly multigravida, third trimester: Secondary | ICD-10-CM | POA: Insufficient documentation

## 2015-03-01 HISTORY — DX: Dysplasia of cervix uteri, unspecified: N87.9

## 2015-03-01 HISTORY — DX: Herpesviral infection of urogenital system, unspecified: A60.00

## 2015-03-01 HISTORY — DX: Supervision of elderly multigravida, unspecified trimester: O09.529

## 2015-03-01 NOTE — Progress Notes (Signed)
L&D Triage Note   39 year old G4 P6 with EDC=03/06/2015 by LMP=05/30/2014 presents at 1 2/7 weeks for a NST for AMA. Reports good FM. Rare contraction. Repeat Cesarean section scheduled in 4 days (03/05/2015).  Exam: BP 116/57 FHR 125-130 with accelerations to 150s-160, moderate variability, one 20 sec deceleration with a contraction when lying on back. Toco: 2 contractions in 60 minutes Ultrasound: AFI= 4.6cm+ 1.8cm+3.15cm=9.56cm. Cephalic presentation  A: IUP at 39.2 weeks with reactive NST and normal AFI  P: DC home with labor instructions Pre op appt on 10/10 at [facility], followed by hospital preop appt  Farrel Conners, CNM

## 2015-03-01 NOTE — Discharge Instructions (Signed)
volver a este piso para sus instrucciones preoperatorias proximo lunes despues de la extraccion de Jean Aguirre

## 2015-03-01 NOTE — OB Triage Note (Signed)
39 yo G4P1 presents for weekly scheduled NST for AMA. [redacted]w[redacted]d. No c/o pain, contractions, or lof. Vital signs WNL.

## 2015-03-04 ENCOUNTER — Encounter
Admission: RE | Admit: 2015-03-04 | Discharge: 2015-03-04 | Disposition: A | Discharge status: Home / Self Care | Payer: Self-pay | Source: Ambulatory Visit | Attending: Obstetrics and Gynecology | Admitting: Obstetrics and Gynecology

## 2015-03-04 DIAGNOSIS — Z01818 Encounter for other preprocedural examination: Secondary | ICD-10-CM | POA: Insufficient documentation

## 2015-03-04 HISTORY — DX: Type 2 diabetes mellitus without complications: E11.9

## 2015-03-04 LAB — DIFFERENTIAL
BASOS ABS: 0 10*3/uL (ref 0–0.1)
Basophils Relative: 0 %
EOS ABS: 0.1 10*3/uL (ref 0–0.7)
EOS PCT: 1 %
LYMPHS ABS: 2 10*3/uL (ref 1.0–3.6)
LYMPHS PCT: 23 %
MONOS PCT: 8 %
Monocytes Absolute: 0.6 10*3/uL (ref 0.2–0.9)
NEUTROS PCT: 68 %
Neutro Abs: 5.8 10*3/uL (ref 1.4–6.5)

## 2015-03-04 LAB — CBC
HEMATOCRIT: 35 % (ref 35.0–47.0)
HEMOGLOBIN: 12.4 g/dL (ref 12.0–16.0)
MCH: 32 pg (ref 26.0–34.0)
MCHC: 35.3 g/dL (ref 32.0–36.0)
MCV: 90.7 fL (ref 80.0–100.0)
Platelets: 205 10*3/uL (ref 150–440)
RBC: 3.86 MIL/uL (ref 3.80–5.20)
RDW: 14.5 % (ref 11.5–14.5)
WBC: 8.6 10*3/uL (ref 3.6–11.0)

## 2015-03-04 LAB — TYPE AND SCREEN
ABO/RH(D): O POS
ANTIBODY SCREEN: NEGATIVE

## 2015-03-04 NOTE — Lactation Note (Signed)
Lactation Consultation Note Lactation Consultant spoke with mom when mom came in for admission interview before repeat C/S scheduled for tomorrow about breast feeding.  Mom reports wanting to do both because tried with last baby for 2 weeks but never got enough milk to feed her baby.  Discussed supply and demand and need to breast feed early and frequently without supplementation to ensure a plentiful supply.  Encouraged mom to tell nurse that she wanted to breast feed as soon as possible after C/S and to ask for lactation consultant for assistance.   Patient Name: Jean Aguirre [id number] Date: 03/04/2015     Maternal Data    Feeding    LATCH Score/Interventions                      Lactation Tools Discussed/Used     Consult Status      Jean Aguirre 03/04/2015, 1:31 PM

## 2015-03-04 NOTE — Patient Instructions (Signed)
Su procedimiento est programado para: __________________ (Your procedure is schedule on)  Presntese a la Sala de partos a las: ______________________ Clinical biochemist to [employer] at)  [facility] [address] [place], [place] [address] Llame a este nmero si tiene problemas en la maana de la ciruga: [phone]  Recuerde:   No coma alimentos ni tome lquidos despus de la medianoche.   No use joyas, maquillajes, ni esmalte de uas. No use lociones, polvos o perfumes.  Puede usar desodorante.   No se afeite antes de la [place].   No traiga objetos de valor al hospital.  [facility] no se hace responsable de ningn tipo de pertenencias u objetos de Licensed conveyancer.   Los lentes de [place], las dentaduras postizas o puentes no se pueden usar en la [place]. Deje su maleta en el auto.  Despus de la ciruga podr traerla a su habitacin. Para los pacientes que sean ingresados al hospital, el tiempo en el cual se le dar de alta es     determinado por su equipo de [place].    Instrucciones especiales: Preparacin de la piel antes de Eustace Quail (Preparing the skin before  Cesarean Section)    Para ayudar a prevenir el riesgo de infeccin en el sitio de su ciruga, le estamos proporcionando unas toallas de papel desechables, que no requieren enjuague, con 2% de gluconato de clorhexidina (Sage 2% Chlorhexidine Gluconate-HCG).  La noche antes de su ciruga: 1. Dchese o bese con agua tibia. 2. No se ponga crema ni perfume.   3. Espere una hora despus de ducharse; la piel debe estar seca y fresca. 4. Ladell Pier paquete de toallas 'Sage' - encontrar 2 toallas desechables adentro. 5. Con una toalla, limpie la parte inferior del abdomen desde la lnea pbica hasta el ombligo y de Neomia Dear cadera a la otra cadera. 6. Con la segunda toalla, limpie la parte delantera de los muslos. 7. Deje secar la piel por un minuto.  NO SE ENJUAGUE. 8. Puede sentir la piel "pegajosa" por unos  minutos. 9. Vstase con ropa limpia, recin lavada. 10. No se duche/bae en la maana de la [place].    Por favor, lea las siguientes hojas de informacin que le dieron: La tos y la respiracin profunda (Coughing and Deep Breathing) y La prevencin de infecciones en el sitio de la ciruga (Surgical Site Infection Prevention).      Espirmetro incentivador Curator) Un espirmetro incentivador es una herramienta que ayuda a Pharmacologist los pulmones limpios y [place]. Mide el modo en que los pulmones se llenan de aire con cada inspiracin. El hecho de inspirar profundamente puede revertir o disminuir la probabilidad de sufrir problemas respiratorios (pulmonares), especialmente infecciones luego de:  Ciruga en el trax o abdomen.  Cualquier ciruga, si es fumador o ha sufrido [employer].  Perodos prolongados de inactividad o inmovilidad. ANTES DEL PROCEDIMIENTO  Si el espirmetro incluye un indicador que [employer] de su mayor esfuerzo, la enfermera o terapeuta especializado en problemas respiratorios lo ajustar en el objetivo deseado.  Si es posible, sintese derecho o ligeramente inclinado hacia delante. Trate de no estar encorvado.  Sostenga el espirmetro incentivador en una posicin recta. INDICACIONES PARA EL USO 1. Si puede, sintese en el borde de la cama o permanezca sentado en la cama o en una silla todo el tiempo que pueda. 2. Sostenga el espirmetro incentivador en una posicin recta. 3. Respire normalmente. 4. Coloque la boquilla en la boca y selle los labios alrededor de  la misma de [place]. 5. Inspire lentamente, tan profundamente como pueda, elevando el pistn o la bola hacia el borde de la columna. 6. Mantenga la respiracin durante 3 a 5 segundos, durante todo el [employer] sea posible. Permita que el pistn o la bola caiga hasta el fondo de la columna. 7. Retire la boquilla de su boca y respire normalmente. 8. Descanse durante  algunos segundos y repita los pasos 1 a 7, al menos 10 veces cada 1 a 2 horas, cuando se encuentre despierto. Tmese su tiempo y haga algunas inspiraciones normales entre las inspiraciones profundas. 9. El espirmetro puede incluir un indicador que mostrar sus [employer] esfuerzos. Utilice este indicador como un objetivo para lograr durante cada repeticin. 10. Luego de cada grupo de 10 inspiraciones profundas, practique tosiendo, para asegurarse que sus pulmones estn limpios. Si tiene una incisin (corte realizado durante la ciruga) sostngala al toser, colocando firmemente una almohada o una toalla enrrollada contra la misma. Una vez que pueda levantarse de la cama, camine por adentro de la casa y [place]. Deber detener el uso del espirmetro incentivador cuando su mdico se lo indique.  RIESGOS Y COMPLICACIONES  Respirar demasiado rpido puede causar mareos. En una situacin extrema, puede causarle un desmayo. Tmese su tiempo de modo que no llegue a Pensions consultant.  Si siente dolor, tome o pida que le administren analgsicos antes de Manufacturing engineer. Es difcil inspirar profundamente cuando se Electronics engineer. DESPUS DE USARLO  Descanse y respire lenta y tranquilamente.  Puede ser de utilidad que lleve un registro de sus progresos. El mdico podr proporcionarle una tabla que lo ayudar con el registro. SOLICITE ATENCIN MDICA SI:  Tiene dificultad para utilizarlos.  Tiene problemas para utilizarlo con la frecuencia que le han indicado.  Los analgsicos no le brindan alivio mientras [place] el espirmetro.  La temperatura se eleva por encima de 100.5 F (38.1 C). SOLICITE ATENCIN MDICA DE INMEDIATO SI:  Tose y elimina esputo con sangre, y esto no le haba ocurrido antes.  La temperatura se eleva por encima de 102 F (38.9 C).  Observa que el sitio de insercin est hinchado o le duele. ASEGRESE QUE:   Comprende estas instrucciones.  Controlar su  enfermedad.  Solicitar ayuda de inmediato si no mejora o empeora.   Esta informacin no tiene Theme park manager el consejo del mdico. Asegrese de hacerle al mdico cualquier pregunta que tenga.   Document Released: 03/08/2009 Document Revised: 06/01/2014 Elsevier Interactive Patient Education 2016 [employer]. Preguntas frecuentes sobre infecciones en el lugar de la Leisure centre manager (Surgical Site Infections FAQs) Ladell Heads es una infeccin en el lugar de la ciruga? Una infeccin en el lugar de la ciruga es una infeccin en el rea del cuerpo donde se realiz la [place]. La mayora de los pacientes no desarrollan una infeccin. Sin embargo, pueden ocurrir infecciones en 1a 3de cada 100pacientes sometidos a Chief Technology Officer. Algunos de los sntomas ms frecuentes de infeccin en el lugar de la ciruga son:  Enrojecimiento y Engineer, mining alrededor del rea donde le realizaron la Leisure centre manager.  Drenaje de un lquido turbio por la herida [place].  Fiebre Pueden tratarse las infecciones en el lugar de la ciruga? S. La mayora de las infecciones en el lugar de la ciruga pueden tratarse con antibiticos. El antibitico que le indiquen depende de la bacteria (microbio) que causa la infeccin. A veces, los pacientes con infeccin en el lugar de la ciruga necesitan otra ciruga para tratar la infeccin. Qu [employer]  hospitales para prevenir las infecciones en el lugar de la ciruga? Para prevenir las infecciones en el lugar de la ciruga, los mdicos, los enfermeros y los dems profesionales:  Se lavan las manos y los brazos [employer] codos con un agente antisptico antes de la [place].  Se lavan las manos con agua y [place] o con un desinfectante para manos a base de alcohol antes y despus de atender a Sport and exercise psychologist.  Pueden quitarle el vello antes de la ciruga con una afeitadora elctrica si tiene vello en el rea donde se realizar la Leisure centre manager. No deben usar Radio producer.  Usan cofias para cubrir  el cabello, Scott, batas y guantes durante la ciruga para Pharmacologist el rea de la ciruga limpia.  Le administrarn antibiticos antes de Curator. En la [employer], los pacientes debern tomar sus antibiticos unos antes de que empiece la ciruga y dejarlos en el plazo de 24horas despus de la [place].  Lave la herida quirrgica con un jabn especial que destruye las bacterias. Qu puedo hacer para prevenir las infecciones en el lugar de la ciruga? Antes de la ciruga: 11. Comunique al mdico cualquier otro problema de salud que tenga. Algunas afecciones como la [place], diabetes y obesidad podran afectar la [place] y [place]. 12. Deje de fumar. Los pacientes que fuman sufren ms infecciones. Hable con su mdico sobre cmo puede dejar de fumar antes de la [place]. 13. No se rasure la zona donde tendr [employer]roleum. El rasurado con hoja de Radio producer la piel y Air traffic controller el desarrollo de una infeccin. Al momento de la ciruga:  Avise si alguien trata de rasurarlo antes de la [place]. Pregunte por qu quieren rasurarlo y hable con el cirujano si tiene [place] duda.  Pregunte si deber recibir antibiticos antes de la ciruga. Despus de la ciruga:  Asegrese de que los mdicos que lo atienden se laven las manos antes de [place], ya sea con agua y [place] o con un desinfectante para manos a base de alcohol.  Si nota que los mdicos no se higienizan las manos, pdales que lo hagan.  Los familiares o amigos que lo visiten no deben tocarle la herida o los vendajes.  Los familiares y amigos deben lavarse las manos con agua y [place] o con un desinfectante para manos a base de alcohol antes y despus de visitarlo. Si nota que no se higienizan las manos, pdales que lo hagan. Qu tengo que hacer cuando salga del hospital y vuelva a casa?  Antes de volver a su casa, el mdico o el enfermero deben explicarle todo lo que necesita saber sobre el  cuidado de la herida. Asegrese de entender cmo cuidar de su herida antes de salir del hospital.  Siempre lvese bien las manos antes y despus de curar su herida.  Antes de retirarse, asegrese de saber con quin comunicarse si tiene dudas o surgen problemas cuando se encuentre en su casa.  Si tiene sntomas de infeccin, como enrojecimiento y Art therapist de la ciruga, drenaje o [place], comunquese inmediatamente con su mdico. Si tiene [place] duda, consulte a su mdico o enfermero. Desarrollado y patrocinado en conjunto por la Sociedad Air traffic controller de Teaching laboratory technician (The Society for Healthcare Epidemiology of [place], [place]); la Sociedad Estadounidense de [employer] (Infectious Diseases Society of [place], IDSA); la Asociacin Americana de Hospitales [facility]); la Asociacin de Profesionales de Control de Infecciones y Marine scientist (Association for Professionals in Infection  Control and Epidemiology, APIC); el Centro para Air traffic controller y la Prevencin de [place] (Center for Disease Control and Prevention, CDC); y la Comisin Programmer, multimedia ([employer]).   Esta informacin no tiene Theme park manager el consejo del mdico. Asegrese de hacerle al mdico cualquier pregunta que tenga.   Document Released: 05/16/2013 Document Revised: 06/01/2014 Elsevier Interactive Patient Education [employer]

## 2015-03-05 ENCOUNTER — Inpatient Hospital Stay: Payer: Medicaid Other | Admitting: Anesthesiology

## 2015-03-05 ENCOUNTER — Encounter
Admission: RE | Disposition: A | Discharge status: Home / Self Care | Payer: Self-pay | Source: Ambulatory Visit | Attending: Obstetrics and Gynecology

## 2015-03-05 ENCOUNTER — Inpatient Hospital Stay
Admission: RE | Admit: 2015-03-05 | Discharge: 2015-03-08 | DRG: 765 | Disposition: A | Discharge status: Home / Self Care | Payer: Medicaid Other | Source: Ambulatory Visit | Attending: Obstetrics and Gynecology | Admitting: Obstetrics and Gynecology

## 2015-03-05 ENCOUNTER — Encounter: Payer: Self-pay | Admitting: *Deleted

## 2015-03-05 DIAGNOSIS — O34211 Maternal care for low transverse scar from previous cesarean delivery: Secondary | ICD-10-CM | POA: Diagnosis present

## 2015-03-05 DIAGNOSIS — O24429 Gestational diabetes mellitus in childbirth, unspecified control: Secondary | ICD-10-CM | POA: Diagnosis present

## 2015-03-05 DIAGNOSIS — O09523 Supervision of elderly multigravida, third trimester: Secondary | ICD-10-CM | POA: Diagnosis not present

## 2015-03-05 DIAGNOSIS — Z7984 Long term (current) use of oral hypoglycemic drugs: Secondary | ICD-10-CM

## 2015-03-05 DIAGNOSIS — O09511 Supervision of elderly primigravida, first trimester: Secondary | ICD-10-CM

## 2015-03-05 DIAGNOSIS — O480 Post-term pregnancy: Secondary | ICD-10-CM | POA: Diagnosis present

## 2015-03-05 DIAGNOSIS — D62 Acute posthemorrhagic anemia: Secondary | ICD-10-CM | POA: Diagnosis not present

## 2015-03-05 DIAGNOSIS — Z3A40 40 weeks gestation of pregnancy: Secondary | ICD-10-CM

## 2015-03-05 DIAGNOSIS — B009 Herpesviral infection, unspecified: Secondary | ICD-10-CM | POA: Diagnosis present

## 2015-03-05 DIAGNOSIS — O9081 Anemia of the puerperium: Secondary | ICD-10-CM | POA: Diagnosis not present

## 2015-03-05 DIAGNOSIS — O9852 Other viral diseases complicating childbirth: Secondary | ICD-10-CM | POA: Diagnosis present

## 2015-03-05 DIAGNOSIS — O34219 Maternal care for unspecified type scar from previous cesarean delivery: Secondary | ICD-10-CM | POA: Diagnosis present

## 2015-03-05 DIAGNOSIS — O24419 Gestational diabetes mellitus in pregnancy, unspecified control: Secondary | ICD-10-CM | POA: Diagnosis present

## 2015-03-05 LAB — RPR: RPR Ser Ql: NONREACTIVE

## 2015-03-05 LAB — GLUCOSE, CAPILLARY
Glucose-Capillary: 64 mg/dL — ABNORMAL LOW (ref 65–99)
Glucose-Capillary: 81 mg/dL (ref 65–99)

## 2015-03-05 LAB — ABO/RH: ABO/RH(D): O POS

## 2015-03-05 LAB — HIV ANTIBODY (ROUTINE TESTING W REFLEX): HIV SCREEN 4TH GENERATION: NONREACTIVE

## 2015-03-05 SURGERY — Surgical Case
Anesthesia: Spinal | Wound class: Clean Contaminated

## 2015-03-05 MED ORDER — ONDANSETRON HCL 4 MG/2ML IJ SOLN
INTRAMUSCULAR | Status: DC | PRN
  Administered 2015-03-05: 4 mg via INTRAVENOUS
  Administered 2015-03-05: 4 mg

## 2015-03-05 MED ORDER — NALBUPHINE HCL 10 MG/ML IJ SOLN
5.0000 mg | INTRAMUSCULAR | Status: DC | PRN
  Filled 2015-03-05: qty 0.5

## 2015-03-05 MED ORDER — MAGNESIUM HYDROXIDE 400 MG/5ML PO SUSP: 30.0000 mL | ORAL | Status: DC | PRN

## 2015-03-05 MED ORDER — NALOXONE HCL 0.4 MG/ML IJ SOLN: 0.4000 mg | INTRAMUSCULAR | Status: DC | PRN

## 2015-03-05 MED ORDER — MORPHINE SULFATE (PF) 0.5 MG/ML IJ SOLN
INTRAMUSCULAR | Status: DC | PRN
  Administered 2015-03-05: .2 mg via EPIDURAL

## 2015-03-05 MED ORDER — EPHEDRINE SULFATE 50 MG/ML IJ SOLN
INTRAMUSCULAR | Status: DC | PRN
  Administered 2015-03-05 (×2): 5 mg via INTRAVENOUS
  Administered 2015-03-05: 10 mg via INTRAVENOUS
  Administered 2015-03-05 (×2): 5 mg via INTRAVENOUS

## 2015-03-05 MED ORDER — INFLUENZA VAC SPLIT QUAD 0.5 ML IM SUSY
0.5000 mL | PREFILLED_SYRINGE | INTRAMUSCULAR | Status: DC
  Filled 2015-03-05: qty 0.5

## 2015-03-05 MED ORDER — DIPHENHYDRAMINE HCL 25 MG PO CAPS: 25.0000 mg | ORAL_CAPSULE | Freq: Four times a day (QID) | ORAL | Status: DC | PRN

## 2015-03-05 MED ORDER — ONDANSETRON HCL 4 MG/2ML IJ SOLN: 4.0000 mg | Freq: Once | INTRAMUSCULAR | Status: DC | PRN

## 2015-03-05 MED ORDER — BUPIVACAINE IN DEXTROSE 0.75-8.25 % IT SOLN
INTRATHECAL | Status: DC | PRN
  Administered 2015-03-05: 1.7 mL via INTRATHECAL

## 2015-03-05 MED ORDER — SCOPOLAMINE 1 MG/3DAYS TD PT72: 1.0000 | MEDICATED_PATCH | Freq: Once | TRANSDERMAL | Status: DC

## 2015-03-05 MED ORDER — SIMETHICONE 80 MG PO CHEW: 80.0000 mg | CHEWABLE_TABLET | ORAL | Status: DC | PRN

## 2015-03-05 MED ORDER — CITRIC ACID-SODIUM CITRATE 334-500 MG/5ML PO SOLN
30.0000 mL | ORAL | Status: AC
  Administered 2015-03-05: 30 mL via ORAL
  Filled 2015-03-05: qty 30

## 2015-03-05 MED ORDER — PRENATAL MULTIVITAMIN CH
1.0000 | ORAL_TABLET | Freq: Every day | ORAL | Status: DC
  Administered 2015-03-06 – 2015-03-08 (×3): 1 via ORAL
  Filled 2015-03-05 (×3): qty 1

## 2015-03-05 MED ORDER — CHLORHEXIDINE GLUCONATE CLOTH 2 % EX PADS
6.0000 | MEDICATED_PAD | Freq: Every day | CUTANEOUS | Status: DC
  Administered 2015-03-05: 6 via TOPICAL

## 2015-03-05 MED ORDER — FENTANYL CITRATE (PF) 100 MCG/2ML IJ SOLN: 25.0000 ug | INTRAMUSCULAR | Status: DC | PRN

## 2015-03-05 MED ORDER — NALBUPHINE HCL 10 MG/ML IJ SOLN
5.0000 mg | Freq: Once | INTRAMUSCULAR | Status: DC | PRN
  Filled 2015-03-05: qty 0.5

## 2015-03-05 MED ORDER — MEPERIDINE HCL 25 MG/ML IJ SOLN: 6.2500 mg | INTRAMUSCULAR | Status: DC | PRN

## 2015-03-05 MED ORDER — LACTATED RINGERS IV SOLN
Freq: Once | INTRAVENOUS | Status: AC
  Administered 2015-03-05: 10:00:00 via INTRAVENOUS

## 2015-03-05 MED ORDER — ONDANSETRON HCL 4 MG/2ML IJ SOLN: 4.0000 mg | Freq: Three times a day (TID) | INTRAMUSCULAR | Status: DC | PRN

## 2015-03-05 MED ORDER — SIMETHICONE 80 MG PO CHEW
80.0000 mg | CHEWABLE_TABLET | Freq: Two times a day (BID) | ORAL | Status: DC
Start: 2015-03-06 — End: 2015-03-08
  Administered 2015-03-06 – 2015-03-08 (×2): 80 mg via ORAL
  Filled 2015-03-05 (×2): qty 1

## 2015-03-05 MED ORDER — OXYCODONE HCL 5 MG PO TABS
5.0000 mg | ORAL_TABLET | Freq: Four times a day (QID) | ORAL | Status: DC | PRN
  Administered 2015-03-06 – 2015-03-08 (×6): 5 mg via ORAL
  Filled 2015-03-05 (×3): qty 1
  Filled 2015-03-05: qty 5
  Filled 2015-03-05 (×2): qty 1

## 2015-03-05 MED ORDER — LACTATED RINGERS IV SOLN: INTRAVENOUS | Status: DC

## 2015-03-05 MED ORDER — ONDANSETRON HCL 4 MG/2ML IJ SOLN
INTRAMUSCULAR | Status: AC
  Filled 2015-03-05: qty 2

## 2015-03-05 MED ORDER — DIPHENHYDRAMINE HCL 25 MG PO CAPS: 25.0000 mg | ORAL_CAPSULE | ORAL | Status: DC | PRN

## 2015-03-05 MED ORDER — OXYTOCIN 40 UNITS IN LACTATED RINGERS INFUSION - SIMPLE MED
INTRAVENOUS | Status: DC | PRN
  Administered 2015-03-05: 10 mL via INTRAVENOUS
  Administered 2015-03-05: 400 mL via INTRAVENOUS
  Administered 2015-03-05: 190 mL via INTRAVENOUS

## 2015-03-05 MED ORDER — DIPHENHYDRAMINE HCL 50 MG/ML IJ SOLN: 12.5000 mg | INTRAMUSCULAR | Status: DC | PRN

## 2015-03-05 MED ORDER — SODIUM CHLORIDE 0.9 % IJ SOLN: 3.0000 mL | INTRAMUSCULAR | Status: DC | PRN

## 2015-03-05 MED ORDER — LANOLIN HYDROUS EX OINT
1.0000 | TOPICAL_OINTMENT | CUTANEOUS | Status: DC | PRN
Start: 2015-03-05 — End: 2015-03-08

## 2015-03-05 MED ORDER — MORPHINE SULFATE (PF) 2 MG/ML IV SOLN: 1.0000 mg | INTRAVENOUS | Status: DC | PRN

## 2015-03-05 MED ORDER — CEFAZOLIN SODIUM-DEXTROSE 2-3 GM-% IV SOLR
2.0000 g | INTRAVENOUS | Status: AC
  Administered 2015-03-05: 2 g via INTRAVENOUS
  Filled 2015-03-05: qty 50

## 2015-03-05 MED ORDER — ACETAMINOPHEN 325 MG PO TABS: 650.0000 mg | ORAL_TABLET | ORAL | Status: DC | PRN

## 2015-03-05 MED ORDER — OXYTOCIN 40 UNITS IN LACTATED RINGERS INFUSION - SIMPLE MED
62.5000 mL/h | INTRAVENOUS | Status: AC
  Administered 2015-03-06: 62.5 mL/h via INTRAVENOUS
  Filled 2015-03-05: qty 1000

## 2015-03-05 MED ORDER — LACTATED RINGERS IV SOLN
INTRAVENOUS | Status: DC
  Administered 2015-03-05 (×2): via INTRAVENOUS

## 2015-03-05 MED ORDER — MENTHOL 3 MG MT LOZG
1.0000 | LOZENGE | OROMUCOSAL | Status: DC | PRN
Start: 2015-03-05 — End: 2015-03-08

## 2015-03-05 MED ORDER — IBUPROFEN 600 MG PO TABS
600.0000 mg | ORAL_TABLET | Freq: Four times a day (QID) | ORAL | Status: DC | PRN
  Filled 2015-03-05: qty 1

## 2015-03-05 MED ORDER — NALOXONE HCL 1 MG/ML IJ SOLN
1.0000 ug/kg/h | INTRAVENOUS | Status: DC | PRN
  Filled 2015-03-05: qty 2

## 2015-03-05 MED ORDER — IBUPROFEN 600 MG PO TABS
600.0000 mg | ORAL_TABLET | Freq: Four times a day (QID) | ORAL | Status: DC | PRN
  Administered 2015-03-06 – 2015-03-08 (×8): 600 mg via ORAL
  Filled 2015-03-05 (×8): qty 1

## 2015-03-05 SURGICAL SUPPLY — 31 items
CANISTER SUCT 3000ML (MISCELLANEOUS) ×3 IMPLANT
CHLORAPREP W/TINT 26ML (MISCELLANEOUS) ×3 IMPLANT
CLOSURE WOUND 1/2 X4 (GAUZE/BANDAGES/DRESSINGS) ×1
DRSG TEGADERM 6X8 (GAUZE/BANDAGES/DRESSINGS) ×6 IMPLANT
DRSG TELFA 3X8 NADH (GAUZE/BANDAGES/DRESSINGS) ×3 IMPLANT
ELECT CAUTERY BLADE 6.4 (BLADE) ×3 IMPLANT
GAUZE SPONGE 4X4 12PLY STRL (GAUZE/BANDAGES/DRESSINGS) ×3 IMPLANT
GLOVE BIO SURGEON STRL SZ7 (GLOVE) ×9 IMPLANT
GLOVE INDICATOR 7.5 STRL GRN (GLOVE) ×9 IMPLANT
GOWN STRL REUS W/ TWL LRG LVL3 (GOWN DISPOSABLE) ×1 IMPLANT
GOWN STRL REUS W/ TWL XL LVL3 (GOWN DISPOSABLE) ×2 IMPLANT
GOWN STRL REUS W/TWL LRG LVL3 (GOWN DISPOSABLE) ×2
GOWN STRL REUS W/TWL XL LVL3 (GOWN DISPOSABLE) ×4
LIQUID BAND (GAUZE/BANDAGES/DRESSINGS) ×3 IMPLANT
NS IRRIG 1000ML POUR BTL (IV SOLUTION) ×3 IMPLANT
PACK C SECTION AR (MISCELLANEOUS) ×3 IMPLANT
PAD GROUND ADULT SPLIT (MISCELLANEOUS) ×3 IMPLANT
PAD OB MATERNITY 4.3X12.25 (PERSONAL CARE ITEMS) ×3 IMPLANT
PAD PREP 24X41 OB/GYN DISP (PERSONAL CARE ITEMS) ×3 IMPLANT
STRIP CLOSURE SKIN 1/2X4 (GAUZE/BANDAGES/DRESSINGS) ×2 IMPLANT
SUT MAXON ABS #0 GS21 30IN (SUTURE) IMPLANT
SUT MNCRL AB 4-0 PS2 18 (SUTURE) ×6 IMPLANT
SUT PLAIN 2 0 (SUTURE)
SUT PLAIN 2 0 XLH (SUTURE) ×3 IMPLANT
SUT PLAIN ABS 2-0 CT1 27XMFL (SUTURE) IMPLANT
SUT VIC AB 0 CT1 36 (SUTURE) ×6 IMPLANT
SUT VIC AB 1 CT1 36 (SUTURE) ×3 IMPLANT
SUT VIC AB 2-0 CT1 36 (SUTURE) ×3 IMPLANT
SUT VIC AB 4-0 FS2 27 (SUTURE) ×3 IMPLANT
SWABSTK COMLB BENZOIN TINCTURE (MISCELLANEOUS) ×3 IMPLANT
SYRINGE 10CC LL (SYRINGE) ×3 IMPLANT

## 2015-03-05 NOTE — Anesthesia Procedure Notes (Signed)
Spinal Patient location during procedure: OR Start time: 03/05/2015 4:40 PM End time: 03/05/2015 4:50 PM Staffing Anesthesiologist: Elijio Miles F Performed by: anesthesiologist  Preanesthetic Checklist Completed: patient identified, site marked, surgical consent, pre-op evaluation, timeout performed, IV checked, risks and benefits discussed and monitors and equipment checked Spinal Block Patient position: sitting Prep: Betadine Patient monitoring: heart rate and blood pressure Approach: midline Location: L3-4 Injection technique: single-shot Needle Needle type: Quincke  Needle gauge: 25 G Needle length: 9 cm Needle insertion depth: 5 cm Assessment Sensory level: T6

## 2015-03-05 NOTE — Discharge Instructions (Addendum)
° °Cesarean Delivery, Care After °Refer to this sheet in the next few weeks. These instructions provide you with information on caring for yourself after your procedure. Your health care provider may also give you specific instructions. Your treatment has been planned according to current medical practices, but problems sometimes occur. Call your health care provider if you have any problems or questions after you go home. °HOME CARE INSTRUCTIONS  °· If you have an On-Q pump, remove it on the 5th day after your surgery, by removing the dressing/bandage and pulling the pump out. Cover the site where the pump strings came out with a band-aid, as needed. °· Only take over-the-counter or prescription medications as directed by your health care provider. °· Do not drink alcohol, especially if you are breastfeeding or taking medication to relieve pain. °· Do not  smoke tobacco. °· Continue to use good perineal care. Good perineal care includes: °¨ Wiping your perineum from front to back. °¨ Keeping your perineum clean. °· Check your surgical cut (incision) daily for increased redness, drainage, swelling, or separation of skin. °· Shower and clean your incision gently with soap and water every day, by letting warm and soapy water run over the incision, and then pat it dry. If your health care provider says it is okay, leave the incision uncovered. Use a bandage (dressing) if the incision is draining fluid or appears irritated. If the adhesive strips across the incision do not fall off within 7 days, carefully peel them off, after a shower. °· Hug a pillow when coughing or sneezing until your incision is healed. This helps to relieve pain. °· Do not use tampons, douches or have sexual intercourse, until your health care provider says it is okay. °· Wear a well-fitting bra that provides breast support. °· Limit wearing support panties or control-top hose. °· Drink enough fluids to keep your urine clear or pale  yellow. °· Eat high-fiber foods such as whole grain cereals and breads, brown rice, beans, and fresh fruits and vegetables every day. These foods may help prevent or relieve constipation. °· Resume activities such as climbing stairs, driving, lifting, exercising, or traveling as directed by your health care provider. °· Try to have someone help you with your household activities and your newborn for at least a few days after you leave the hospital. °· Rest as much as possible. Try to rest or take a nap when your newborn is sleeping. °· Increase your activities gradually. °· Do not lift more than 15lbs until directed by a provider. °· Keep all of your scheduled postpartum appointments. It is very important to keep your scheduled follow-up appointments. At these appointments, your health care provider will be checking to make sure that you are healing physically and emotionally. °SEEK MEDICAL CARE IF:  °· You are passing large clots from your vagina. Save any clots to show your health care provider. °· You have a foul smelling discharge from your vagina. °· You have trouble urinating. °· You are urinating frequently. °· You have pain when you urinate. °· You have a change in your bowel movements. °· You have increasing redness, pain, or swelling near your incision. °· You have pus draining from your incision. °· Your incision is separating. °· You have painful, hard, or reddened breasts. °· You have a severe headache. °· You have blurred vision or see spots. °· You feel sad or depressed. °· You have thoughts of hurting yourself or your newborn. °· You have questions about your   care, the care of your newborn, or medications. °· You are dizzy or light-headed. °· You have a rash. °· You have pain, redness, or swelling at the site of the removed intravenous access (IV) tube. °· You have nausea or vomiting. °· You stopped breastfeeding and have not had a menstrual period within 12 weeks of stopping. °· You are not  breastfeeding and have not had a menstrual period within 12 weeks of delivery. °· You have a fever. °SEEK IMMEDIATE MEDICAL CARE IF: °· You have persistent pain. °· You have chest pain. °· You have shortness of breath. °· You faint. °· You have leg pain. °· You have stomach pain. °· Your vaginal bleeding saturates 2 or more sanitary pads in 1 hour. °MAKE SURE YOU:  °· Understand these instructions. °· Will watch your condition. °· Will get help right away if you are not doing well or get worse. °Document Released: 01/31/2002 Document Revised: 09/25/2013 Document Reviewed: 01/06/2012 °ExitCare® Patient Information ©2015 ExitCare, LLC. This information is not intended to replace advice given to you by your health care provider. Make sure you discuss any questions you have with your health care provider. ° °Call your doctor for increased pain or vaginal bleeding, temperature above 100.4, depression, or concerns.  No strenuous activity or heavy lifting for 6 weeks.  No intercourse, tampons, douching, or enemas for 6 weeks.  No tub baths-showers only.  No driving for 2 weeks or while taking pain medications.  Continue prenatal vitamin and iron.  Keep incision clean and dry.  Call your doctor for incision concerns including redness, swelling, bleeding or drainage, or if begins to come apart.  Increase calories and fluids while breastfeeding. ° ° °

## 2015-03-05 NOTE — Op Note (Signed)
Operative Note   SURGERY DATE: 03/05/2015  PRE-OP DIAGNOSIS:  *Intrauterine pregnancy @ 40/5 weeks *History of prior cesarean section and desire for repat *Advanced maternal age *Likely gestational diabetes *History of HSV on valtrex  POST-OP DIAGNOSIS: Same   PROCEDURE: repeat low transverse cesarean section via pfannenstiel skin incision with double layer uterine closure  SURGEON: Surgeon(s) and Role:    * [place] Bing, MD - Primary  ASSISTANT:    * Vena Austria, MD   ANESTHESIA: spinal  ESTIMATED BLOOD LOSS:   DRAINS: indwelling foley UOP   TOTAL IV FLUIDS: crystalloid  VTE Prophylaxis: SCDs to bilateral lower extremities during case  Antibiotics: Two grams of Cefazolin were given., given within 1 hour of skin incision  SPECIMENS: placenta to pathology  COMPLICATIONS: None   FINDINGS: No intra-abdominal adhesions were noted. Grossly normal uterus, tubes and ovaries. clear amniotic fluid, cephalic female infant, weight 4060gm, APGARs 9/9, intact placenta  PROCEDURE IN DETAIL: The patient was taken to the operating room where anesthesia was administered and normal fetal heart tones were confirmed. She was then prepped and draped in the normal fashion in the dorsal supine position with a leftward tilt.  After a time out was performed, a pfannensteil  skin incision was made with the scalpel and carried through to the underlying layer of fascia. The fascia was then incised at the midline and this incision was extended laterally with the mayo scissors. Attention was turned to the superior aspect of the fascial incision which was grasped with the kocher clamps x 2, tented up and the rectus muscles were dissected off with the scalpel. In a similar fashion the inferior aspect of the fascial incision was grasped with the kocher clamps, tented up and the rectus muscles dissected off with the mayo scissors. The rectus muscles were then separated in the midline  and the peritoneum was entered bluntly. The bladder blade was inserted and the vesicouterine peritoneum was identified, tented up and entered with the metzenbaum scissors. This incision was extended laterally and the bladder flap was created digitally. The bladder blade was reinserted.  A low transverse hysterotomy was made with the scalpel until the endometrial cavity was breached and the amniotic sac ruptured with the Allis clamp, yielding clear amniotic fluid. This incision was extended bluntly and the infant's head, shoulders and body were delivered atraumatically.The cord was clamped x 2 and cut, and the infant was handed to the awaiting pediatricians.  The placenta was then gradually expressed from the uterus and then the uterus was exteriorized and cleared of all clots and debris. The hysterotomy was repaired with a running suture of 1-0 monocryl. A second imbricating layer of 1-0 monocryl suture was then placed. Several figure-of-eight sutures of monocryl and vicryl were added to achieve excellent hemostasis.   The uterus and adnexa were then returned to the abdomen, and the hysterotomy and all operative sites were reinspected and excellent hemostasis was noted after irrigation and suction of the abdomen with warm saline.  The fascia was reapproximated with 0 vicryl in a simple running fashion bilaterally. The subcutaneous layer was then reapproximated with interrupted sutures of 2-0 plain gut, and the skin was then closed with 4-0 monocryl, in a subcuticular fashion.  The patient  tolerated the procedure well. Sponge, lap, needle, and instrument counts were correct x 2. The patient was transferred to the recovery room awake, alert and breathing independently in stable condition.  Cornelia Copa MD [facility] Pager [phone]

## 2015-03-05 NOTE — Anesthesia Preprocedure Evaluation (Signed)
Anesthesia Evaluation  Patient identified by MRN, date of birth, ID band Patient awake    Reviewed: Allergy & Precautions, NPO status , Patient's Chart, lab work & pertinent test results  Airway Mallampati: II       Dental no notable dental hx.    Pulmonary neg pulmonary ROS,    Pulmonary exam normal        Cardiovascular negative cardio ROS Normal cardiovascular exam     Neuro/Psych negative neurological ROS     GI/Hepatic negative GI ROS, Neg liver ROS,   Endo/Other  diabetes, Type 2, Oral Hypoglycemic Agents  Renal/GU negative Renal ROS  negative genitourinary   Musculoskeletal negative musculoskeletal ROS (+)   Abdominal Normal abdominal exam  (+)   Peds negative pediatric ROS (+)  Hematology negative hematology ROS (+)   Anesthesia Other Findings   Reproductive/Obstetrics negative OB ROS                             Anesthesia Physical Anesthesia Plan  ASA: II  Anesthesia Plan: Spinal   Post-op Pain Management:    Induction:   Airway Management Planned: Nasal Cannula  Additional Equipment:   Intra-op Plan:   Post-operative Plan:   Informed Consent: I have reviewed the patients History and Physical, chart, labs and discussed the procedure including the risks, benefits and alternatives for the proposed anesthesia with the patient or authorized representative who has indicated his/her understanding and acceptance.     Plan Discussed with: CRNA  Anesthesia Plan Comments:         Anesthesia Quick Evaluation

## 2015-03-05 NOTE — Discharge Summary (Signed)
Obstetrical Discharge Summary  Date of Admission: 03/05/2015 Date of Discharge: 03/08/2015  Primary OB: [facility]  Gestational Age at Delivery: [redacted]w[redacted]d   Antepartum complications: Advanced maternal age. Likely gestational diabetes. History of HSV on valtrex Reason for Admission: scheduled repeat c-section Date of Delivery: 03/08/2015  Delivered By: Cornelia Copa MD Delivery Type: repeat cesarean section, low transverse incision Intrapartum complications/course: None Anesthesia: spinal Placenta: expressed. Intact: yes. To pathology: yes.  Laceration: none Episiotomy: none Baby: Liveborn female, APGARs9/9, weight 4060 g.   Postpartum course: routine postpartum course. Patient ambulating, tolerating, PO, pain well-controlled on PO pain medications, and voiding spontaneously. Vitals stable and normal. She had a questionable diagnosis of gestational diabetes and had her blood glucose values monitored in the postpartum period and all values were normal. She desired discharge on post-op day #3.  Disposition: Home  Rh Immune globulin given: not applicable Rubella vaccine given: not applicable Tdap vaccine given in AP or PP setting: yes Flu vaccine given in AP or PP setting: ordered prior to discharge  Contraception: plans nexplanon  Prenatal/Postnatal Panel: O POS//Rubella Immune//Varicella Immune//RPR negative//HIV negative/HepB Surface Ag negative//pap 2014 //plans to breastfeed  Plan:  Jean Aguirre was discharged to home in good condition. Follow-up appointment with Dr. Vergie Living in 1 week for an incision check  Discharge Medications:   Medication List    TAKE these medications        ibuprofen 600 MG tablet  Commonly known as:  ADVIL,MOTRIN  Take 1 tablet (600 mg total) by mouth every 6 (six) hours as needed for fever, headache, mild pain, moderate pain or cramping.     oxyCODONE-acetaminophen 5-325 MG tablet  Commonly known as:  PERCOCET/ROXICET  Take 1-2  tablets by mouth every 6 (six) hours as needed for moderate pain or severe pain.     PRENATAL VITAMIN PO  Take 1 tablet by mouth daily.     valACYclovir 1000 MG tablet  Commonly known as:  VALTREX  Take 500 mg by mouth 2 (two) times daily.       Conard Novak, MD, FACOG 03/08/2015 9:46 AM

## 2015-03-05 NOTE — Transfer of Care (Signed)
Immediate Anesthesia Transfer of Care Note  Patient: Jean Aguirre  Procedure(s) Performed: Procedure(s): CESAREAN SECTION (N/A)  Patient Location: PACU  Anesthesia Type:Spinal  Level of Consciousness: awake, alert , oriented and patient cooperative  Airway & Oxygen Therapy: Patient Spontanous Breathing  Post-op Assessment: Report given to RN  Post vital signs: Reviewed and stable  Last Vitals:  Filed Vitals:   03/05/15 1816  BP: 124/61  Pulse: 66  Temp: 97.60F  Resp: 18    Complications: No apparent anesthesia complications

## 2015-03-05 NOTE — Progress Notes (Signed)
OB Note RN told by patient that she ate toast at 0630. Will wait until 8hrs s/p to do c-section. rNST and will get baseline BS given ?GDM dx.  Cornelia Copa MD [facility]  Pager: [phone]

## 2015-03-05 NOTE — Progress Notes (Signed)
OB Note Patient seen with interpreter and all questions asked and answered. BS pending. RNST, occasional UCs  Cornelia Copa MD [facility]  Pager: [phone]

## 2015-03-05 NOTE — H&P (Addendum)
OB H&P  All questions asked and answered and pt ready for repeat c-section. Pt and partner asked if they needed an interpreter for any questions with me but they declined. Can proceed when first c section is done and OR can proceed. NST pending. Labs from yesterday negative.    Cornelia Copa MD [facility]  Pager: [phone]

## 2015-03-06 ENCOUNTER — Encounter: Payer: Self-pay | Admitting: Obstetrics and Gynecology

## 2015-03-06 DIAGNOSIS — O34219 Maternal care for unspecified type scar from previous cesarean delivery: Secondary | ICD-10-CM | POA: Diagnosis present

## 2015-03-06 LAB — GLUCOSE, CAPILLARY: Glucose-Capillary: 72 mg/dL (ref 65–99)

## 2015-03-06 LAB — HEMATOCRIT: HEMATOCRIT: 34.2 % — AB (ref 35.0–47.0)

## 2015-03-06 MED ORDER — OXYCODONE-ACETAMINOPHEN 5-325 MG PO TABS
1.0000 | ORAL_TABLET | Freq: Four times a day (QID) | ORAL | Status: DC | PRN
  Administered 2015-03-08: 1 via ORAL
  Filled 2015-03-06: qty 1

## 2015-03-06 NOTE — Anesthesia Postprocedure Evaluation (Signed)
  Anesthesia Post-op Note  Patient: Jean BreedingVeneranda Aguirre  Procedure(s) Performed: Procedure(s): CESAREAN SECTION (N/A)  Anesthesia type:Spinal  Patient location: 344  Post pain: Pain level controlled  Post assessment: Post-op Vital signs reviewed, Patient's Cardiovascular Status Stable, Respiratory Function Stable, Patent Airway and No signs of Nausea or vomiting  Post vital signs: Reviewed and stable  Last Vitals:  Filed Vitals:   03/06/15 0800  BP: 97/54  Pulse: 76  Temp: 36.9 C  Resp: 18    Level of consciousness: awake, alert  and patient cooperative  Complications: No apparent anesthesia complications

## 2015-03-06 NOTE — Progress Notes (Signed)
  Subjective:  Doing well, no concerns, pain well controlled, mimal lochia   Objective:   Filed Vitals:   03/06/15 0344 03/06/15 0353 03/06/15 0800 03/06/15 1150  BP: 110/59  97/54 109/59  Pulse: 76  76 78  Temp: 97.7 F (36.5 C)  98.4 F (36.9 C) 98 F (36.7 C)  TempSrc:   Oral Oral  Resp: 20 18 18 18   Height:      Weight:      SpO2: 100% 97% 98%     Intake/Output Summary (Last 24 hours) at 03/06/15 1152 Last data filed at 03/06/15 0940  Gross per 24 hour  Intake 1602.5 ml  Output   2075 ml  Net -472.5 ml   General: NAD Pulmonary: no increased work of breathing Abdomen: non-distended, non-tender, fundus firm at level of umbilicus Incision: D/C/I dressing Extremities: no edema, no erythema, no tenderness  Results for orders placed or performed during the hospital encounter of 03/05/15 (from the past 72 hour(s))  Glucose, capillary     Status: Abnormal   Collection Time: 03/05/15  4:53 PM  Result Value Ref Range   Glucose-Capillary 64 (L) 65 - 99 mg/dL  Glucose, capillary     Status: None   Collection Time: 03/05/15 10:05 PM  Result Value Ref Range   Glucose-Capillary 81 65 - 99 mg/dL  Hematocrit     Status: Abnormal   Collection Time: 03/06/15  6:57 AM  Result Value Ref Range   HCT 34.2 (L) 35.0 - 47.0 %  Glucose, capillary     Status: None   Collection Time: 03/06/15  7:45 AM  Result Value Ref Range   Glucose-Capillary 72 65 - 99 mg/dL     Assessment:   39 y.o. [id number] postoperativeday #1 RLTCS   Plan:  1) Acute blood loss anemia - hemodynamically stable and asymptomatic - po ferrous sulfate  2) O pos / RI / VZI   3) TDAP 12/28/14 & 02/20/15  4) Breast & Bottle undecided  5) Disposition anticipate discharge POD2-3

## 2015-03-06 NOTE — Anesthesia Post-op Follow-up Note (Signed)
  Anesthesia Pain Follow-up Note  Patient: [place] BreedingVeneranda Martinez-Hernandez  Day #: 1  Date of Follow-up: 03/06/2015 Time: 10:31 AM  Last Vitals:  Filed Vitals:   03/06/15 0800  BP: 97/54  Pulse: 76  Temp: 36.9 C  Resp: 18    Level of Consciousness: alert  Pain: 1 /10   Side Effects:None  Catheter Site Exam:clean, dry, no drainage  Plan: D/C from anesthesia care  Starling Mannsurtis,  Kamali Nephew A

## 2015-03-07 LAB — SURGICAL PATHOLOGY

## 2015-03-07 NOTE — Progress Notes (Signed)
POD #2 repeat Cesarean section Subjective:   Passing gas "all night." No BM. Voiding without difficulty. Nurses urging patient to ambulate/ take pain medicine...instead of staying in bed. Given discharge instructions....but patient decided to stay another day.  Objective:  Blood pressure 112/68, pulse 78, temperature 98.9 F (37.2 C), temperature source Axillary, resp. rate 20, height 5' (1.524 m), weight 160 lb (72.576 kg), SpO2 99 %, unknown if currently breastfeeding.  General: NAD Heart: RRR without murmur Pulmonary: no increased work of breathing/ CTA Abdomen: non-distended, tender, initially felt like fundus was at U+4- that area softened after palpating? And it then felt that  fundus firm at level of umbilicus or U-1 Incision: Extremities: no edema, no erythema, no tenderness  Results for orders placed or performed during the hospital encounter of 03/05/15 (from the past 72 hour(s))  Glucose, capillary     Status: Abnormal   Collection Time: 03/05/15  4:53 PM  Result Value Ref Range   Glucose-Capillary 64 (L) 65 - 99 mg/dL  Glucose, capillary     Status: None   Collection Time: 03/05/15 10:05 PM  Result Value Ref Range   Glucose-Capillary 81 65 - 99 mg/dL  Hematocrit     Status: Abnormal   Collection Time: 03/06/15  6:57 AM  Result Value Ref Range   HCT 34.2 (L) 35.0 - 47.0 %  Glucose, capillary     Status: None   Collection Time: 03/06/15  7:45 AM  Result Value Ref Range   Glucose-Capillary 72 65 - 99 mg/dL     Assessment:   39 y.o. [id number] postoperativeday # 2   Plan:   1) stable pod #2-continue postoperative care/ postpartum care  2) --/--/O POS (10/10 1122) / Immune (04/05 0000) / Varicella Immune  3) TDAP status-UTD   4) Breast/Bottle/Contraception-Nexplanon  5) Disposition-discharge home today  Raymar Joiner, CNM

## 2015-03-08 MED ORDER — OXYCODONE-ACETAMINOPHEN 5-325 MG PO TABS: 1.0000 | ORAL_TABLET | Freq: Four times a day (QID) | ORAL | Status: DC | PRN

## 2015-03-08 MED ORDER — IBUPROFEN 600 MG PO TABS: 600.0000 mg | ORAL_TABLET | Freq: Four times a day (QID) | ORAL | Status: DC | PRN

## 2015-03-08 NOTE — Progress Notes (Signed)
Prenatal records indicate that pt received TDaP vaccine on 02/20/15 and Influenza vaccine on 12/28/14. Reynold BowenSusan Paisley Lakina Mcintire, RN 03/08/2015 2:32 PM

## 2015-03-08 NOTE — Lactation Note (Signed)
This note was copied from the chart of Jean Aguirre. Lactation Consultation Note  Patient Name: Jean Aguirre ZOXWR'UToday's Date: 03/08/2015 Reason for consult: Other (Comment) (pumping, states not getting any milk)   Maternal Data Does the patient have breastfeeding experience prior to this delivery?: Yes Spanish interpreter present while I encouraged pt to offer breast before formula and this would help with milk production, baby needs stimulation at breast to stay awake and nurse  Feeding Feeding Type: Breast Milk  LATCH Score/Interventions Latch: Grasps breast easily, tongue down, lips flanged, rhythmical sucking. Intervention(s): Assist with latch  Audible Swallowing: Spontaneous and intermittent Intervention(s): Hand expression  Type of Nipple: Everted at rest and after stimulation  Comfort (Breast/Nipple): Soft / non-tender     Hold (Positioning): Assistance needed to correctly position infant at breast and maintain latch.  LATCH Score: 9  Lactation Tools Discussed/Used WIC Program: Yes   Consult Status      Dyann KiefMarsha D Aloni Chuang 03/08/2015, 2:14 PM

## 2015-03-08 NOTE — Progress Notes (Signed)
Discharge instructions provided.  Pt and sig other verbalize understanding of all instructions and follow-up care via interpreter.  Prescriptions and incision hygiene kit given.  Pt discharged to home with infant at 1645 via wheelchair by CNA. Reed Breech, RN 03/08/2015 7:33 PM

## 2016-04-21 LAB — HM PAP SMEAR: HM Pap smear: NEGATIVE

## 2017-04-11 IMAGING — US US OB FOLLOW-UP
1 series · 13 of 28 positions shown · non-contrast
Comparison: none

CLINICAL DATA: Advanced maternal age. Current assigned gestational
age of 39 weeks 0 days.

EXAM:
OBSTETRIC ULTRASOUND >14 wks

[Series 1: us ob follow-up · 0.31mm/px · 13 of 29 slices shown]
[im 2/29]
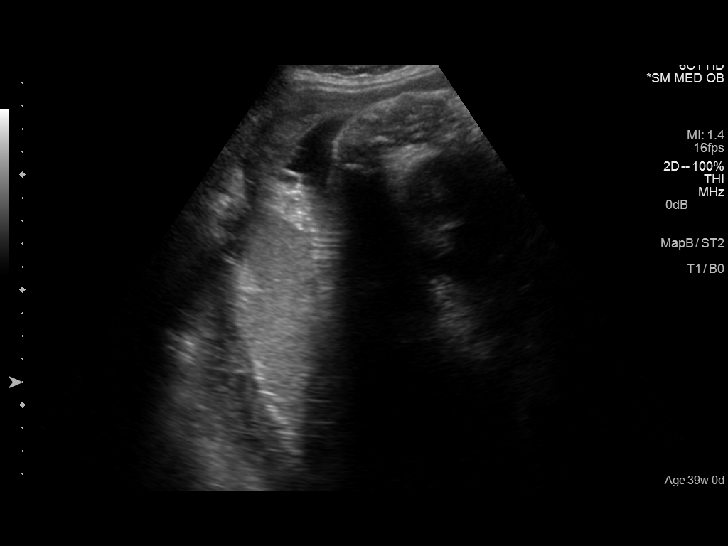
[im 4/29]
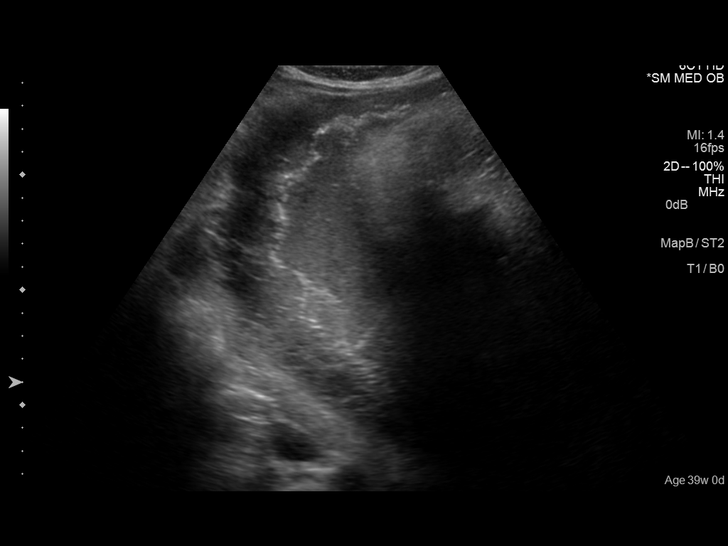
[im 6/29]
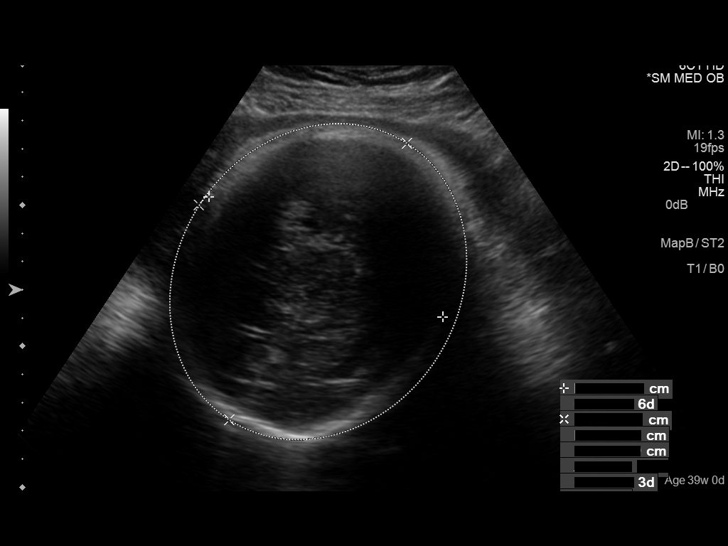
[im 8/29]
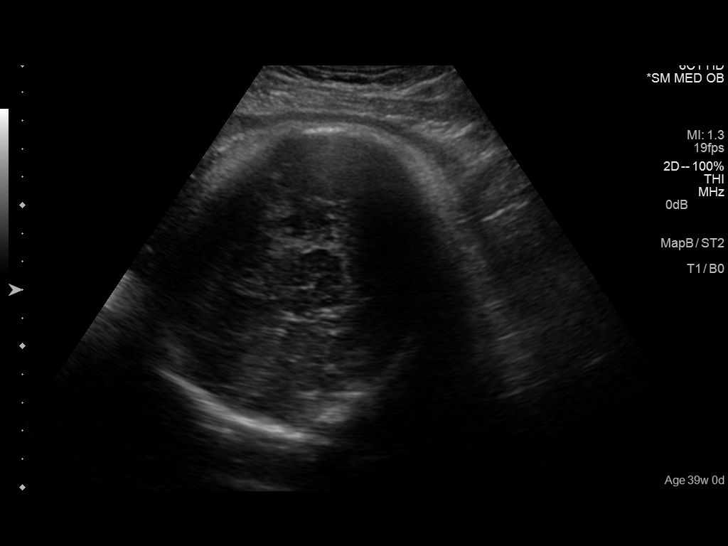
[im 10/29]
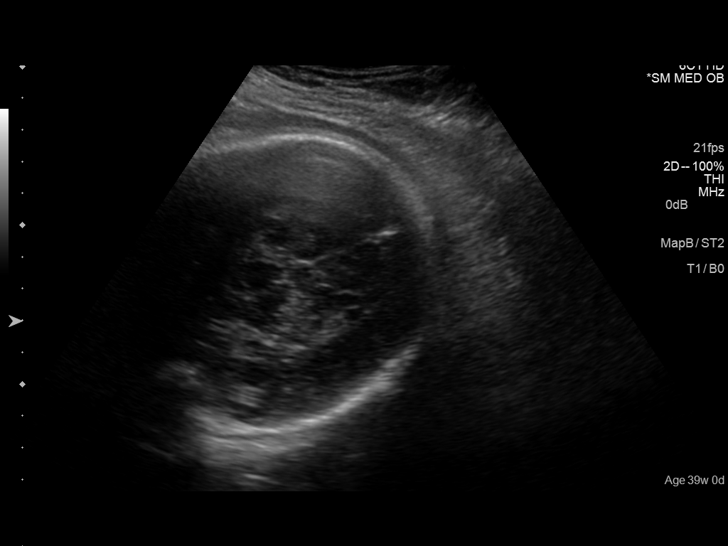
[im 12/29]
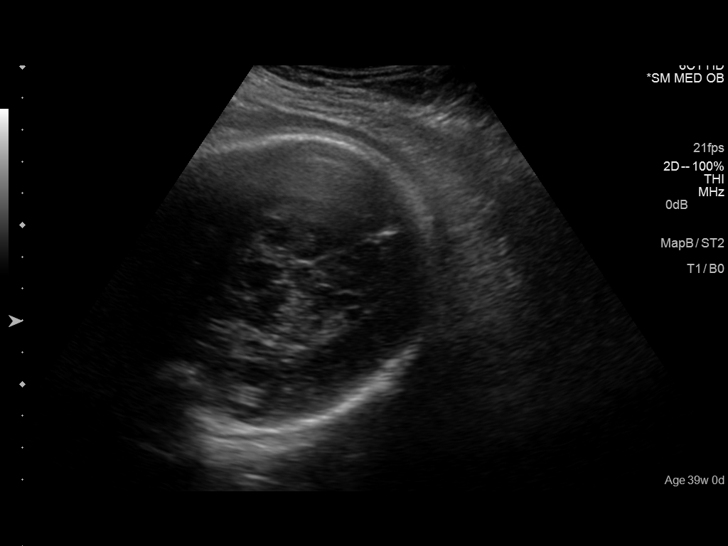
[im 15/29]
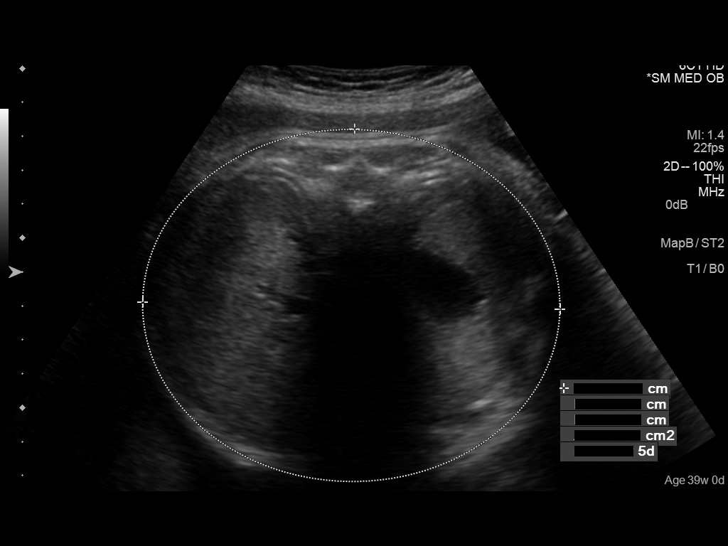
[im 17/29]
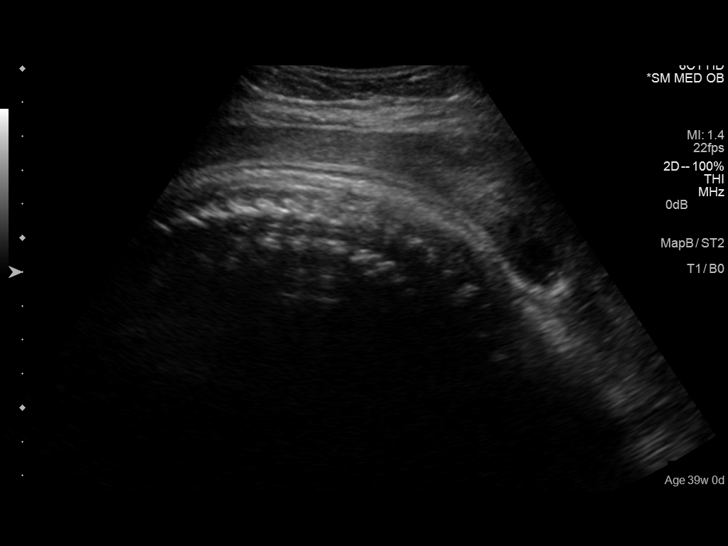
[im 19/29]
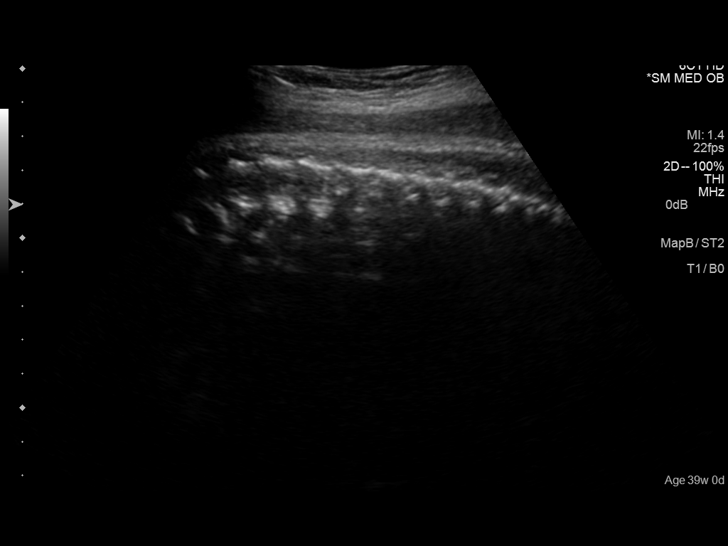
[im 21/29]
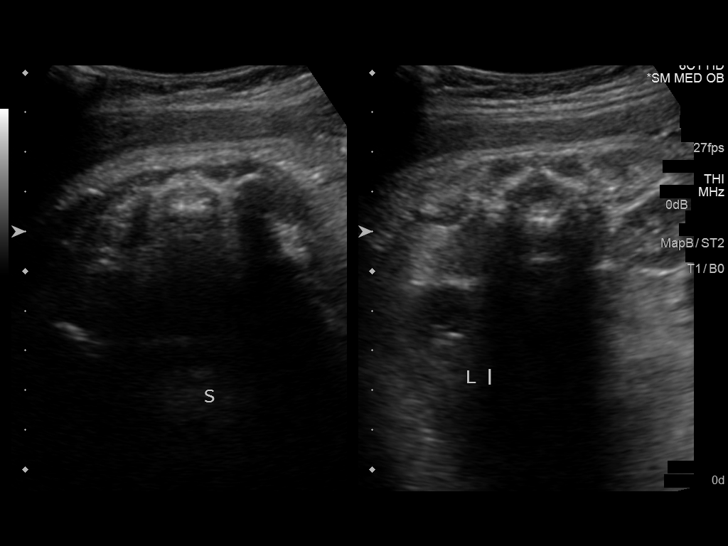
[im 23/29]
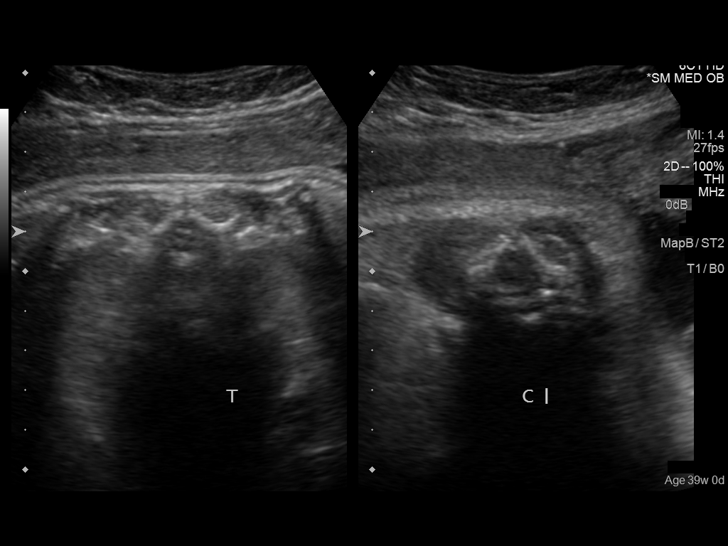
[im 25/29]
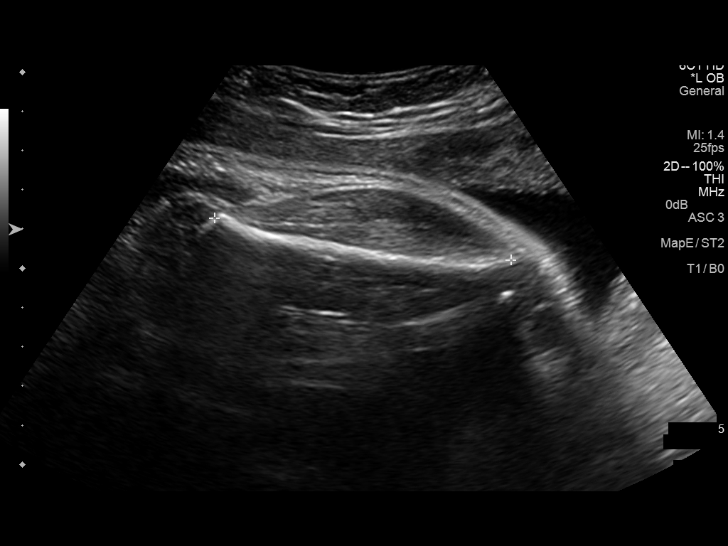
[im 27/29]
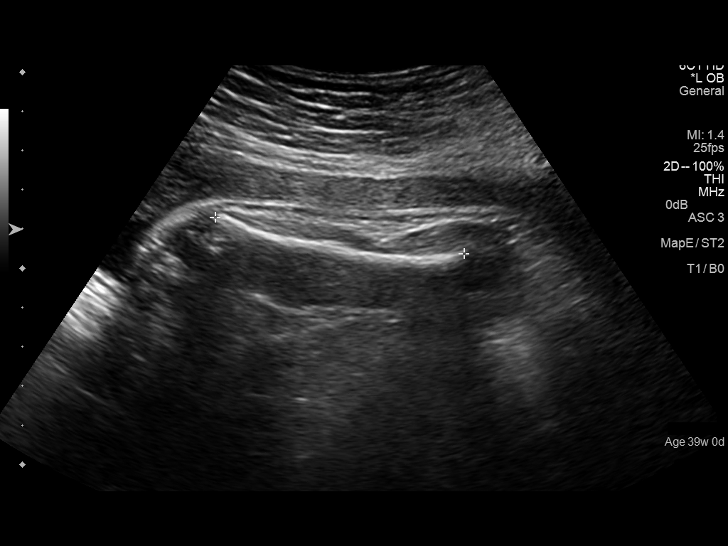

[13 of 28 positions shown; findings below may reference images not displayed]

FINDINGS: Number of Fetuses: 1

Heart Rate:  131 bpm

Movement: Yes

Presentation: Cephalic

Previa: No

Placental Location: Posterior

Amniotic Fluid (Subjective): Normal

Amniotic Fluid (Objective):

AFI 8.5 cm (5%ile= 7.2 cm, 95%= 22.6 cm for 39 wks)

FETAL BIOMETRY

BPD:  9.16cm 37w 1d

HC:    34.14cm  39w   2d

AC:   35.73cm  39w   4d

FL:   7.63cm  39w   0d

Current Mean GA: 38w 3d              US EDC: 03-04-15

Estimated Fetal Weight:  3698g    73%ile

FETAL ANATOMY

Lateral Ventricles:  Normal

Thalami/CSP:  Normal

Posterior Fossa:  Not visualized

Nuchal Region: N/A    NFT= RAH/Sarah

Upper Lip:  Not visualized

Spine:  Normal

4 Chamber Heart on Left:  Not visualized

LVOT:  Not visualized

RVOT:  Not visuailzed

Stomach on Left:  Normal

3 Vessel Cord:  Not visualized

Cord Insertion site:  Not visualized

Kidneys:  Normal

Bladder:  Normal

Extremities:  Not visualized

Sex:  Not visualized

Technically difficult due to:  Advanced gestational age

Maternal Findings:

Cervix: Not visualized (advanced gestational age greater than 34
weeks)
IMPRESSION: Single living IUP in cephalic presentation. Appropriate fetal
growth, with estimated fetal weight at the 73rd percentile for
assigned gestational age of 39 weeks 0 days.

## 2018-04-25 LAB — HM HIV SCREENING LAB: HM HIV Screening: NEGATIVE

## 2019-07-05 DIAGNOSIS — Z91410 Personal history of adult physical and sexual abuse: Secondary | ICD-10-CM

## 2019-07-05 DIAGNOSIS — R4189 Other symptoms and signs involving cognitive functions and awareness: Secondary | ICD-10-CM

## 2019-08-01 ENCOUNTER — Ambulatory Visit: Payer: Self-pay | Attending: Oncology

## 2019-08-01 ENCOUNTER — Other Ambulatory Visit: Payer: Self-pay

## 2019-08-01 ENCOUNTER — Ambulatory Visit
Admission: RE | Admit: 2019-08-01 | Discharge: 2019-08-01 | Disposition: A | Discharge status: Home / Self Care | Payer: Self-pay | Source: Ambulatory Visit | Attending: Oncology | Admitting: Oncology

## 2019-08-01 VITALS — BP 120/85 | HR 63 | Temp 97.3°F | Ht 60.0 in | Wt 147.8 lb

## 2019-08-01 DIAGNOSIS — Z0000 Encounter for general adult medical examination without abnormal findings: Secondary | ICD-10-CM

## 2019-08-01 DIAGNOSIS — Z9189 Other specified personal risk factors, not elsewhere classified: Secondary | ICD-10-CM

## 2019-08-01 NOTE — Progress Notes (Signed)
  Subjective:     Patient ID: Jean Aguirre, female   DOB: 08/15/1975, 44 y.o.   MRN: [id number]  HPI   Review of Systems     Objective:   Physical Exam Chest:     Breasts:        Right: No swelling, bleeding, inverted nipple, mass, nipple discharge, skin change or tenderness.        Left: No swelling, bleeding, inverted nipple, mass, nipple discharge, skin change or tenderness.          Assessment:     44 year old Hispanic patient presents for B[facility]CP clinic visit.  Instructed patient on breast self awareness using teach back method.  Clinical breast exam unremarkable. Symmetrical upper outer fibroglandular tissue palpated.  No discrete mass or lump palpated.  Patient reports her sister was diagnosed with Breast Cancer at age 46, and is now deceased.  Tyrer-Cusik score Lifetime risk 28.5%.   Risk Assessment    Risk Scores      08/01/2019   Last edited by: Jim Like, RN   5-year risk: 1.2 %   Lifetime risk: 13.3 %             Plan:     Sent for bilateral screening mammogram.  Referred to High Risk Breast Clinic Consult funded by [employer].

## 2019-08-09 ENCOUNTER — Inpatient Hospital Stay
Admission: RE | Admit: 2019-08-09 | Discharge: 2019-08-09 | Disposition: A | Discharge status: Home / Self Care | Payer: Self-pay | Source: Ambulatory Visit | Attending: *Deleted | Admitting: *Deleted

## 2019-08-09 ENCOUNTER — Other Ambulatory Visit: Payer: Self-pay | Admitting: *Deleted

## 2019-08-09 DIAGNOSIS — Z1231 Encounter for screening mammogram for malignant neoplasm of breast: Secondary | ICD-10-CM

## 2019-08-14 NOTE — Progress Notes (Signed)
Letter mailed from [facility] to notify of normal mammogram results.  Patient to return in one year for annual screening.  She is scheduled to see Dr. Cathie Hoops in [facility] on 08/17/19.  Copy to [facility].

## 2019-08-16 ENCOUNTER — Encounter: Payer: Self-pay | Admitting: Oncology

## 2019-08-16 NOTE — Progress Notes (Signed)
Patient contacted for New patient visit. Patient aware of appointment. No concerns voiced. Chart reviewed and updated.

## 2019-08-17 ENCOUNTER — Inpatient Hospital Stay: Payer: Self-pay | Attending: Oncology | Admitting: Oncology

## 2019-08-17 ENCOUNTER — Encounter: Payer: Self-pay | Admitting: Oncology

## 2019-08-17 VITALS — BP 126/58 | HR 71 | Temp 97.5°F | Wt 148.2 lb

## 2019-08-17 DIAGNOSIS — Z79899 Other long term (current) drug therapy: Secondary | ICD-10-CM | POA: Insufficient documentation

## 2019-08-17 DIAGNOSIS — Z9189 Other specified personal risk factors, not elsewhere classified: Secondary | ICD-10-CM

## 2019-08-17 DIAGNOSIS — Z803 Family history of malignant neoplasm of breast: Secondary | ICD-10-CM | POA: Insufficient documentation

## 2019-08-17 DIAGNOSIS — Z791 Long term (current) use of non-steroidal anti-inflammatories (NSAID): Secondary | ICD-10-CM | POA: Insufficient documentation

## 2019-08-17 NOTE — Progress Notes (Signed)
Hematology/Oncology Consult note [facility] [phone] [phone]   Patient Care Team: Guillermina City, MD as PCP - General (Family Medicine) Rico Junker, RN as Registered Nurse Theodore Demark, RN as Registered Nurse  REFERRING PROVIDER: Lloyd Huger, MD  CHIEF COMPLAINTS/REASON FOR VISIT:  Evaluation of high risk for breast cancer  HISTORY OF PRESENTING ILLNESS:   Jean Aguirre is a  44 y.o.  female with PMH listed below was seen in consultation at the request of  Grayland Ormond, Kathlene November, MD  for evaluation of high risk for breast cancer  Patient's sister was diagnosed of breast cancer at age of 13, deceased at age of 75.  No other family members with history of cancer. Menarche 46 Age at first childbirth: 44 No previous breast biopsy for radiation Previous history of oral contraceptive 08/01/2019 bilateral screening mammogram showed no findings suspicious for malignancy.   Review of Systems  Constitutional: Negative for appetite change, chills, fatigue and fever.  HENT:   Negative for hearing loss and voice change.   Eyes: Negative for eye problems.  Respiratory: Negative for chest tightness and cough.   Cardiovascular: Negative for chest pain.  Gastrointestinal: Negative for abdominal distention, abdominal pain and blood in stool.  Endocrine: Negative for hot flashes.  Genitourinary: Negative for difficulty urinating and frequency.   Musculoskeletal: Negative for arthralgias.  Skin: Negative for itching and rash.  Neurological: Negative for extremity weakness.  Hematological: Negative for adenopathy.  Psychiatric/Behavioral: Negative for confusion.    MEDICAL HISTORY:  Past Medical History:  Diagnosis Date  . AMA (advanced maternal age) multigravida 8+   . Cervical dysplasia   . Diabetes mellitus without complication ([place])    Gestational  . Genital herpes     SURGICAL HISTORY: Past Surgical  History:  Procedure Laterality Date  . CESAREAN SECTION  2010   breech presentation  . CESAREAN SECTION N/A 03/05/2015   Procedure: CESAREAN SECTION;  Surgeon: Aletha Halim, MD;  Location: [facility] ORS;  Service: Obstetrics;  Laterality: N/A;  . LEEP     CIN II-III    SOCIAL HISTORY: Social History   Socioeconomic History  . Marital status: Single    Spouse name: Not on file  . Number of children: Not on file  . Years of education: Not on file  . Highest education level: Not on file  Occupational History  . Not on file  Tobacco Use  . Smoking status: Never Smoker  . Smokeless tobacco: Never Used  Substance and Sexual Activity  . Alcohol use: No  . Drug use: No  . Sexual activity: Yes  Other Topics Concern  . Not on file  Social History Narrative  . Not on file   Social Determinants of Health   Financial Resource Strain:   . Difficulty of Paying Living Expenses:   Food Insecurity:   . Worried About Charity fundraiser in the Last Year:   . Arboriculturist in the Last Year:   Transportation Needs:   . Film/video editor (Medical):   Marland Kitchen Lack of Transportation (Non-Medical):   Physical Activity:   . Days of Exercise per Week:   . Minutes of Exercise per Session:   Stress:   . Feeling of Stress :   Social Connections:   . Frequency of Communication with Friends and Family:   . Frequency of Social Gatherings with Friends and Family:   . Attends Religious Services:   . Active Member of Clubs  or Organizations:   . Attends Banker Meetings:   Marland Kitchen Marital Status:   Intimate Partner Violence:   . Fear of Current or Ex-Partner:   . Emotionally Abused:   Marland Kitchen Physically Abused:   . Sexually Abused:     FAMILY HISTORY: Family History  Problem Relation Age of Onset  . Diabetes Mother   . Hypertension Mother   . Breast cancer Sister        deceased from CA at 37y/o    ALLERGIES:  is allergic to pineapple.  MEDICATIONS:  Current Outpatient  Medications  Medication Sig Dispense Refill  . ibuprofen (ADVIL,MOTRIN) 600 MG tablet Take 1 tablet (600 mg total) by mouth every 6 (six) hours as needed for fever, headache, mild pain, moderate pain or cramping. 30 tablet 0  . oxyCODONE-acetaminophen (PERCOCET/ROXICET) 5-325 MG tablet Take 1-2 tablets by mouth every 6 (six) hours as needed for moderate pain or severe pain. 30 tablet 0  . tamsulosin (FLOMAX) 0.4 MG CAPS capsule Take by mouth.    . valACYclovir (VALTREX) 1000 MG tablet Take 500 mg by mouth 2 (two) times daily.    . Prenatal Vit-Fe Fumarate-FA (PRENATAL VITAMIN PO) Take 1 tablet by mouth daily.     No current facility-administered medications for this visit.     PHYSICAL EXAMINATION: ECOG PERFORMANCE STATUS: 0 - Asymptomatic Vitals:   08/17/19 0935  BP: (!) 126/58  Pulse: 71  Temp: (!) 97.5 F (36.4 C)  SpO2: 99%   Filed Weights   08/17/19 0935  Weight: 148 lb 3.2 oz (67.2 kg)    Physical Exam Constitutional:      General: She is not in acute distress. HENT:     Head: Normocephalic and atraumatic.  Eyes:     General: No scleral icterus. Cardiovascular:     Rate and Rhythm: Normal rate and regular rhythm.     Heart sounds: Normal heart sounds.  Pulmonary:     Effort: Pulmonary effort is normal. No respiratory distress.     Breath sounds: No wheezing.  Abdominal:     General: Bowel sounds are normal. There is no distension.     Palpations: Abdomen is soft.  Musculoskeletal:        General: No deformity. Normal range of motion.     Cervical back: Normal range of motion and neck supple.  Skin:    General: Skin is warm and dry.     Findings: No erythema or rash.  Neurological:     Mental Status: She is alert and oriented to person, place, and time. Mental status is at baseline.     Cranial Nerves: No cranial nerve deficit.     Coordination: Coordination normal.  Psychiatric:        Mood and Affect: Mood normal.   Breast exam was performed in seated  and lying down position. No evidence of any discrete palpable masses bilaterally. No palpable axillary lymphadenopathy.      LABORATORY DATA:  I have reviewed the data as listed Lab Results  Component Value Date   WBC 8.6 03/04/2015   HGB 12.4 03/04/2015   HCT 34.2 (L) 03/06/2015   MCV 90.7 03/04/2015   PLT 205 03/04/2015   No results for input(s): NA, K, CL, CO2, GLUCOSE, BUN, CREATININE, CALCIUM, GFRNONAA, GFRAA, PROT, ALBUMIN, AST, ALT, ALKPHOS, BILITOT, BILIDIR, IBILI in the last 8760 hours. Iron/TIBC/Ferritin/ %Sat No results found for: IRON, TIBC, FERRITIN, IRONPCTSAT    RADIOGRAPHIC STUDIES: I have personally reviewed the radiological  images as listed and agreed with the findings in the report. MS DIGITAL SCREENING TOMO BILATERAL  Result Date: 08/09/2019 CLINICAL DATA:  Screening. EXAM: DIGITAL SCREENING BILATERAL MAMMOGRAM WITH TOMO AND CAD COMPARISON:  Previous exam(s). ACR Breast Density Category c: The breast tissue is heterogeneously dense, which may obscure small masses. FINDINGS: There are no findings suspicious for malignancy. Images were processed with CAD. IMPRESSION: No mammographic evidence of malignancy. A result letter of this screening mammogram will be mailed directly to the patient. RECOMMENDATION: Screening mammogram in one year. (Code:SM-B-01Y) BI-RADS CATEGORY  1: Negative. Electronically Signed   By: Elberta Fortis M.D.   On: 08/09/2019 13:09   MM Outside Films Mammo  Result Date: 08/10/2019 This examination belongs to an outside facility and is stored here for comparison purposes only.  Contact the originating outside institution for any associated report or interpretation.  MM Outside Films Mammo  Result Date: 08/10/2019 This examination belongs to an outside facility and is stored here for comparison purposes only.  Contact the originating outside institution for any associated report or interpretation.  MM Outside Films Mammo  Result Date:  08/10/2019 This examination belongs to an outside facility and is stored here for comparison purposes only.  Contact the originating outside institution for any associated report or interpretation.     ASSESSMENT & PLAN:  1. At high risk for breast cancer    Family history of breast cancer.  IBIS Rockne Menghini model predicts lifetime risk of breast cancer 23%. I recommend patient to discuss with genetic counselor about genetic testing. Pending on her genetic testing results, we will further discuss frequency and modality of breast cancer screening and need of chemoprevention. She is interested.  I will refer to genetic counselor. Mammogram images were independently reviewed by me and discussed with patient.  Orders Placed This Encounter  Procedures  . Ambulatory referral to Genetics    Referral Priority:   Routine    Referral Type:   Consultation    Referral Reason:   Specialty Services Required    Referred to Provider:   Lacy Duverney T    Number of Visits Requested:   1    All questions were answered. The patient knows to call the clinic with any problems questions or concerns.   Return of visit: 1 month after genetic counseling. Thank you for this kind referral and the opportunity to participate in the care of this patient. A copy of today's note is routed to referring provider    Rickard Patience, MD, PhD Hematology Oncology [facility] at [facility] Pager- [phone] 08/17/2019

## 2019-08-28 ENCOUNTER — Inpatient Hospital Stay: Payer: Self-pay | Attending: Oncology | Admitting: Licensed Clinical Social Worker

## 2019-08-28 ENCOUNTER — Other Ambulatory Visit: Payer: Self-pay

## 2019-09-07 ENCOUNTER — Inpatient Hospital Stay: Payer: Self-pay | Attending: Oncology | Admitting: Licensed Clinical Social Worker

## 2019-09-14 ENCOUNTER — Ambulatory Visit: Payer: Self-pay | Admitting: Oncology

## 2019-09-22 ENCOUNTER — Telehealth: Payer: Self-pay

## 2019-09-22 NOTE — Telephone Encounter (Signed)
Done..  5/3 MD Only appt has been cx as requested.

## 2019-09-22 NOTE — Telephone Encounter (Signed)
Please cancel MD appt on 5/3 (f/u 4 weeks after genetic counselor). Patient hat not had appt with genetic counselor. Sent message to [facility] to reschedule. Pt is aware.

## 2019-09-25 ENCOUNTER — Inpatient Hospital Stay: Payer: Self-pay | Admitting: Oncology

## 2019-09-25 NOTE — Telephone Encounter (Signed)
Rescheduled for appt with genetic counselor on 5/17 @ 3 pm. Pt notified.

## 2019-10-09 ENCOUNTER — Inpatient Hospital Stay: Payer: Self-pay | Admitting: Licensed Clinical Social Worker

## 2019-10-10 NOTE — Telephone Encounter (Signed)
Hi ladies, this patient has rescheduled genetic counselor appt 3x and still has not completed. I called her prior to her Mychart appt on 5/17,but it looks like she still did not complete. Dr. Cathie Hoops wanted pt to follow up 4 weeks after genetic counselor appt (no appts scheduled currently).   Glennette Galster.

## 2019-10-11 ENCOUNTER — Telehealth: Payer: Self-pay

## 2019-10-11 NOTE — Telephone Encounter (Signed)
I will try to reach patient to see if she will respond.  Thank you!!!

## 2019-10-12 ENCOUNTER — Telehealth: Payer: Self-pay | Admitting: *Deleted

## 2019-10-12 NOTE — Telephone Encounter (Signed)
Called patient via [facility] interpreter, Sue Lush [phone].   Patient has rescheduled her genetic appointment 3 times.  Patient states she got disconnected on the last telephone call.  She would like to reschedule her appointment and prefers a Tuesday morning.  Message sent to Merleen Nicely, RN to assist with rescheduling patient.

## 2019-10-19 ENCOUNTER — Telehealth: Payer: Self-pay | Admitting: Licensed Clinical Social Worker

## 2019-11-08 ENCOUNTER — Telehealth: Payer: Self-pay

## 2019-11-08 NOTE — Telephone Encounter (Signed)
Per Efraim Kaufmann R, patient left message and she could not understand what VM said. I returned patient's call and she wants to cancel genetics counselor appt at this time, she states she is not interested at the moment. I offered to reschedule but she declined stating that she has already rescheduled multiple times and never has time for the appt. I told her to call back to reschedule if she decides to proceed with genetic testing in the future.

## 2019-11-09 ENCOUNTER — Inpatient Hospital Stay: Payer: Self-pay | Admitting: Licensed Clinical Social Worker

## 2019-11-09 ENCOUNTER — Inpatient Hospital Stay: Payer: Self-pay

## 2019-12-14 NOTE — Telephone Encounter (Signed)
Unable to contact patient to schedule follow-up with genetic counselor, and Dr. Cathie Hoops.

## 2020-10-27 ENCOUNTER — Other Ambulatory Visit: Payer: Self-pay

## 2020-10-27 ENCOUNTER — Ambulatory Visit
Admission: EM | Admit: 2020-10-27 | Discharge: 2020-10-27 | Disposition: A | Discharge status: Home / Self Care | Payer: Self-pay | Attending: Family Medicine | Admitting: Family Medicine

## 2020-10-27 DIAGNOSIS — H10531 Contact blepharoconjunctivitis, right eye: Secondary | ICD-10-CM

## 2020-10-27 MED ORDER — POLYMYXIN B-TRIMETHOPRIM 10000-0.1 UNIT/ML-% OP SOLN: 2.0000 [drp] | Freq: Three times a day (TID) | OPHTHALMIC | 0 refills | Status: AC

## 2020-10-27 NOTE — ED Triage Notes (Addendum)
Patient presents to Urgent Care with complaints of right eye redness with drainage since yesterday. Pt states she has used eye drops unsure of name purchased from [facility].   Denies fever or changes in vision.
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
## 2020-12-20 ENCOUNTER — Encounter: Payer: Self-pay | Admitting: Family Medicine

## 2021-01-21 ENCOUNTER — Encounter: Payer: Self-pay | Admitting: *Deleted

## 2021-01-21 ENCOUNTER — Ambulatory Visit
Admission: RE | Admit: 2021-01-21 | Discharge: 2021-01-21 | Disposition: A | Discharge status: Home / Self Care | Payer: Self-pay | Source: Ambulatory Visit | Attending: Oncology | Admitting: Oncology

## 2021-01-21 ENCOUNTER — Other Ambulatory Visit: Payer: Self-pay

## 2021-01-21 ENCOUNTER — Ambulatory Visit: Payer: Self-pay | Attending: Oncology | Admitting: *Deleted

## 2021-01-21 DIAGNOSIS — Z0000 Encounter for general adult medical examination without abnormal findings: Secondary | ICD-10-CM

## 2021-01-21 NOTE — Progress Notes (Signed)
  Subjective:     Patient ID: Jean Aguirre, female   DOB: 1976/03/12, 45 y.o.   MRN: [id number]  HPI  [facility] Medical History Record - 01/20/21 1536       Breast History   Screening cycle New    Provider ([facility]) [facility]    Initial Mammogram 01/14/21    Last Mammogram Annual    Last Mammogram Date 08/09/19    Provider (Mammogram)  [facility]    Recent Breast Symptoms None      Breast Cancer History   Breast Cancer History Patient and mother/daughter/sister have had breast cancer    Comments/Details Sister at 36/ deceased at 11;  Another sister has benign breast lump per patient;  Patient refused [facility] last year.      Previous History of Breast Problems   Breast Surgery or Biopsy None    Breast Implants N/A    BSE Done Monthly      Gynecological/Obstetrical History   LMP 12/29/20    Is there any chance that the client could be pregnant?  No    Age at menarche 67    Age at menopause n/a    PAP smear history Annually    Date of last PAP  07/06/19    Provider (PAP) Bernestine Amass neg/neg    Age at first live birth 67    Breast fed children No    DES Exposure Unkown    Cervical, Uterine or Ovarian cancer No    Family history of Cervial, Uterine or Ovarian cancer No    Hysterectomy No    Cervix removed No    Ovaries removed No    Laser/Cryosurgery --   possiblee  laser surgery after delivery   Current method of birth control Condom    Current method of Estrogen/Hormone replacement None    Smoking history None               Review of Systems     Objective:   Physical Exam     Assessment:     Patient was pre-screened for [facility] via televisit by Coralee Rud, RN.  Health History and verbal consent obtained.  No breast problems at this time.  Last pap on 07/06/19 was negative /negative.  Next pap due in 2026.  Patient has been screened for eligibility.  She does not have any insurance, Medicare or Medicaid.  She also meets financial eligibility.    Risk Assessment     Risk Scores       01/21/2021 08/01/2019   Last edited by: Scarlett Presto, RN Jim Like, RN   5-year risk: 1.2 % 1.2 %   Lifetime risk: 13.1 % 13.3 %               Plan:     Screening mammogram ordered.  Will follow up per [facility] protocol.

## 2021-01-29 ENCOUNTER — Encounter: Payer: Self-pay | Admitting: *Deleted

## 2021-01-29 NOTE — Progress Notes (Signed)
Letter mailed from [facility] to inform patient of her normal mammogram results.  Patient is to follow-up with annual screening in one year. 

## 2021-07-18 ENCOUNTER — Other Ambulatory Visit: Payer: Self-pay

## 2021-07-18 ENCOUNTER — Encounter (HOSPITAL_COMMUNITY): Payer: Self-pay

## 2021-07-18 ENCOUNTER — Inpatient Hospital Stay (HOSPITAL_COMMUNITY)
Admission: EM | Admit: 2021-07-18 | Discharge: 2021-07-20 | DRG: 339 | Disposition: A | Discharge status: Home / Self Care | Payer: Self-pay | Attending: General Surgery | Admitting: General Surgery

## 2021-07-18 DIAGNOSIS — D72829 Elevated white blood cell count, unspecified: Secondary | ICD-10-CM

## 2021-07-18 DIAGNOSIS — K3580 Unspecified acute appendicitis: Secondary | ICD-10-CM

## 2021-07-18 DIAGNOSIS — K3532 Acute appendicitis with perforation and localized peritonitis, without abscess: Principal | ICD-10-CM | POA: Diagnosis present

## 2021-07-18 DIAGNOSIS — R109 Unspecified abdominal pain: Secondary | ICD-10-CM | POA: Diagnosis present

## 2021-07-18 DIAGNOSIS — Z833 Family history of diabetes mellitus: Secondary | ICD-10-CM

## 2021-07-18 DIAGNOSIS — Z91018 Allergy to other foods: Secondary | ICD-10-CM

## 2021-07-18 DIAGNOSIS — R188 Other ascites: Secondary | ICD-10-CM | POA: Diagnosis present

## 2021-07-18 DIAGNOSIS — E871 Hypo-osmolality and hyponatremia: Secondary | ICD-10-CM | POA: Diagnosis present

## 2021-07-18 DIAGNOSIS — Z20822 Contact with and (suspected) exposure to covid-19: Secondary | ICD-10-CM | POA: Diagnosis present

## 2021-07-18 DIAGNOSIS — E119 Type 2 diabetes mellitus without complications: Secondary | ICD-10-CM | POA: Diagnosis present

## 2021-07-18 DIAGNOSIS — Z79899 Other long term (current) drug therapy: Secondary | ICD-10-CM

## 2021-07-18 LAB — URINALYSIS, ROUTINE W REFLEX MICROSCOPIC
Bilirubin Urine: NEGATIVE
Glucose, UA: NEGATIVE mg/dL
Hgb urine dipstick: NEGATIVE
Ketones, ur: 5 mg/dL — AB
Leukocytes,Ua: NEGATIVE
Nitrite: NEGATIVE
Protein, ur: NEGATIVE mg/dL
Specific Gravity, Urine: 1.005 (ref 1.005–1.030)
pH: 7 (ref 5.0–8.0)

## 2021-07-18 LAB — LIPASE, BLOOD: Lipase: 28 U/L (ref 11–51)

## 2021-07-18 LAB — COMPREHENSIVE METABOLIC PANEL
ALT: 16 U/L (ref 0–44)
AST: 22 U/L (ref 15–41)
Albumin: 4.1 g/dL (ref 3.5–5.0)
Alkaline Phosphatase: 63 U/L (ref 38–126)
Anion gap: 3 — ABNORMAL LOW (ref 5–15)
BUN: 15 mg/dL (ref 6–20)
CO2: 26 mmol/L (ref 22–32)
Calcium: 8.6 mg/dL — ABNORMAL LOW (ref 8.9–10.3)
Chloride: 103 mmol/L (ref 98–111)
Creatinine, Ser: 0.77 mg/dL (ref 0.44–1.00)
GFR, Estimated: >60 mL/min (ref >60)
Glucose, Bld: 119 mg/dL — ABNORMAL HIGH (ref 70–99)
Potassium: 3.7 mmol/L (ref 3.5–5.1)
Sodium: 132 mmol/L — ABNORMAL LOW (ref 135–145)
Total Bilirubin: 0.5 mg/dL (ref 0.3–1.2)
Total Protein: 7.1 g/dL (ref 6.5–8.1)

## 2021-07-18 LAB — CBC
HCT: 39.8 % (ref 36.0–46.0)
Hemoglobin: 13.2 g/dL (ref 12.0–15.0)
MCH: 29.2 pg (ref 26.0–34.0)
MCHC: 33.2 g/dL (ref 30.0–36.0)
MCV: 88.1 fL (ref 80.0–100.0)
Platelets: 228 10*3/uL (ref 150–400)
RBC: 4.52 MIL/uL (ref 3.87–5.11)
RDW: 13.1 % (ref 11.5–15.5)
WBC: 11.7 10*3/uL — ABNORMAL HIGH (ref 4.0–10.5)
nRBC: 0 % (ref 0.0–0.2)

## 2021-07-18 LAB — HCG, SERUM, QUALITATIVE: Preg, Serum: NEGATIVE

## 2021-07-18 MED ORDER — ONDANSETRON HCL 4 MG/2ML IJ SOLN
4.0000 mg | Freq: Once | INTRAMUSCULAR | Status: AC
  Administered 2021-07-18: 4 mg via INTRAVENOUS
  Filled 2021-07-18: qty 2

## 2021-07-18 MED ORDER — KETOROLAC TROMETHAMINE 30 MG/ML IJ SOLN
30.0000 mg | Freq: Once | INTRAMUSCULAR | Status: AC
  Administered 2021-07-18: 30 mg via INTRAVENOUS
  Filled 2021-07-18: qty 1

## 2021-07-18 MED ORDER — SODIUM CHLORIDE 0.9 % IV BOLUS
1000.0000 mL | Freq: Once | INTRAVENOUS | Status: AC
  Administered 2021-07-18: 1000 mL via INTRAVENOUS

## 2021-07-18 MED ORDER — MORPHINE SULFATE (PF) 4 MG/ML IV SOLN
4.0000 mg | Freq: Once | INTRAVENOUS | Status: AC
  Administered 2021-07-18: 4 mg via INTRAVENOUS
  Filled 2021-07-18: qty 1

## 2021-07-18 NOTE — ED Triage Notes (Signed)
Pov from home . Cc generalized abdominal pain and weakness. Only nausea.  Initial BP 80/59  repeat 99/51

## 2021-07-19 ENCOUNTER — Other Ambulatory Visit: Payer: Self-pay

## 2021-07-19 ENCOUNTER — Inpatient Hospital Stay (HOSPITAL_COMMUNITY): Payer: Self-pay | Admitting: Anesthesiology

## 2021-07-19 ENCOUNTER — Encounter (HOSPITAL_COMMUNITY): Payer: Self-pay | Admitting: Internal Medicine

## 2021-07-19 ENCOUNTER — Emergency Department (HOSPITAL_COMMUNITY): Payer: Self-pay

## 2021-07-19 ENCOUNTER — Encounter (HOSPITAL_COMMUNITY)
Admission: EM | Disposition: A | Discharge status: Home / Self Care | Payer: Self-pay | Source: Home / Self Care | Attending: General Surgery

## 2021-07-19 DIAGNOSIS — D72829 Elevated white blood cell count, unspecified: Secondary | ICD-10-CM

## 2021-07-19 DIAGNOSIS — K352 Acute appendicitis with generalized peritonitis, without abscess: Secondary | ICD-10-CM

## 2021-07-19 DIAGNOSIS — K3532 Acute appendicitis with perforation and localized peritonitis, without abscess: Secondary | ICD-10-CM

## 2021-07-19 DIAGNOSIS — R109 Unspecified abdominal pain: Secondary | ICD-10-CM | POA: Diagnosis present

## 2021-07-19 DIAGNOSIS — R1084 Generalized abdominal pain: Secondary | ICD-10-CM

## 2021-07-19 DIAGNOSIS — E871 Hypo-osmolality and hyponatremia: Secondary | ICD-10-CM | POA: Diagnosis present

## 2021-07-19 DIAGNOSIS — K3580 Unspecified acute appendicitis: Secondary | ICD-10-CM

## 2021-07-19 HISTORY — PX: LAPAROSCOPIC APPENDECTOMY: SHX408

## 2021-07-19 LAB — COMPREHENSIVE METABOLIC PANEL
ALT: 15 U/L (ref 0–44)
AST: 18 U/L (ref 15–41)
Albumin: 3.4 g/dL — ABNORMAL LOW (ref 3.5–5.0)
Alkaline Phosphatase: 52 U/L (ref 38–126)
Anion gap: 7 (ref 5–15)
BUN: 12 mg/dL (ref 6–20)
CO2: 23 mmol/L (ref 22–32)
Calcium: 8.3 mg/dL — ABNORMAL LOW (ref 8.9–10.3)
Chloride: 108 mmol/L (ref 98–111)
Creatinine, Ser: 0.62 mg/dL (ref 0.44–1.00)
GFR, Estimated: >60 mL/min (ref >60)
Glucose, Bld: 111 mg/dL — ABNORMAL HIGH (ref 70–99)
Potassium: 3.9 mmol/L (ref 3.5–5.1)
Sodium: 138 mmol/L (ref 135–145)
Total Bilirubin: 0.9 mg/dL (ref 0.3–1.2)
Total Protein: 6.2 g/dL — ABNORMAL LOW (ref 6.5–8.1)

## 2021-07-19 LAB — RESP PANEL BY RT-PCR (FLU A&B, COVID) ARPGX2
Influenza A by PCR: NEGATIVE
Influenza B by PCR: NEGATIVE
SARS Coronavirus 2 by RT PCR: NEGATIVE

## 2021-07-19 LAB — CBC
HCT: 35.1 % — ABNORMAL LOW (ref 36.0–46.0)
Hemoglobin: 11.3 g/dL — ABNORMAL LOW (ref 12.0–15.0)
MCH: 28 pg (ref 26.0–34.0)
MCHC: 32.2 g/dL (ref 30.0–36.0)
MCV: 86.9 fL (ref 80.0–100.0)
Platelets: 197 10*3/uL (ref 150–400)
RBC: 4.04 MIL/uL (ref 3.87–5.11)
RDW: 13.1 % (ref 11.5–15.5)
WBC: 13.1 10*3/uL — ABNORMAL HIGH (ref 4.0–10.5)
nRBC: 0 % (ref 0.0–0.2)

## 2021-07-19 LAB — MAGNESIUM: Magnesium: 2.1 mg/dL (ref 1.7–2.4)

## 2021-07-19 LAB — PHOSPHORUS: Phosphorus: 3.6 mg/dL (ref 2.5–4.6)

## 2021-07-19 LAB — HIV ANTIBODY (ROUTINE TESTING W REFLEX): HIV Screen 4th Generation wRfx: NONREACTIVE

## 2021-07-19 SURGERY — APPENDECTOMY, LAPAROSCOPIC
Anesthesia: General | Site: Abdomen

## 2021-07-19 MED ORDER — ACETAMINOPHEN 500 MG PO TABS
1000.0000 mg | ORAL_TABLET | Freq: Four times a day (QID) | ORAL | Status: DC
  Administered 2021-07-19 – 2021-07-20 (×3): 1000 mg via ORAL
  Filled 2021-07-19 (×4): qty 2

## 2021-07-19 MED ORDER — FENTANYL CITRATE (PF) 100 MCG/2ML IJ SOLN
INTRAMUSCULAR | Status: DC | PRN
  Administered 2021-07-19: 25 ug via INTRAVENOUS
  Administered 2021-07-19 (×2): 50 ug via INTRAVENOUS
  Administered 2021-07-19 (×2): 25 ug via INTRAVENOUS

## 2021-07-19 MED ORDER — MEPERIDINE HCL 50 MG/ML IJ SOLN: 6.2500 mg | INTRAMUSCULAR | Status: DC | PRN

## 2021-07-19 MED ORDER — MIDAZOLAM HCL 5 MG/5ML IJ SOLN
INTRAMUSCULAR | Status: DC | PRN
  Administered 2021-07-19: 2 mg via INTRAVENOUS

## 2021-07-19 MED ORDER — PROPOFOL 10 MG/ML IV BOLUS
INTRAVENOUS | Status: AC
  Filled 2021-07-19: qty 20

## 2021-07-19 MED ORDER — SODIUM CHLORIDE 0.9 % IR SOLN
Status: DC | PRN
  Administered 2021-07-19: 1000 mL

## 2021-07-19 MED ORDER — ONDANSETRON HCL 4 MG/2ML IJ SOLN: 4.0000 mg | Freq: Once | INTRAMUSCULAR | Status: DC | PRN

## 2021-07-19 MED ORDER — KETOROLAC TROMETHAMINE 30 MG/ML IJ SOLN
30.0000 mg | Freq: Once | INTRAMUSCULAR | Status: AC
  Administered 2021-07-19: 30 mg via INTRAVENOUS

## 2021-07-19 MED ORDER — ONDANSETRON HCL 4 MG/2ML IJ SOLN
4.0000 mg | Freq: Four times a day (QID) | INTRAMUSCULAR | Status: DC | PRN
Start: 2021-07-19 — End: 2021-07-20
  Administered 2021-07-20: 4 mg via INTRAVENOUS
  Filled 2021-07-19: qty 2

## 2021-07-19 MED ORDER — DEXAMETHASONE SODIUM PHOSPHATE 10 MG/ML IJ SOLN
INTRAMUSCULAR | Status: DC | PRN
  Administered 2021-07-19: 10 mg via INTRAVENOUS

## 2021-07-19 MED ORDER — LACTATED RINGERS IV SOLN: INTRAVENOUS | Status: DC | PRN

## 2021-07-19 MED ORDER — PIPERACILLIN-TAZOBACTAM 3.375 G IVPB
3.3750 g | Freq: Three times a day (TID) | INTRAVENOUS | Status: DC
Start: 2021-07-19 — End: 2021-07-20
  Administered 2021-07-19 – 2021-07-20 (×4): 3.375 g via INTRAVENOUS
  Filled 2021-07-19 (×4): qty 50

## 2021-07-19 MED ORDER — MORPHINE SULFATE (PF) 2 MG/ML IV SOLN: 2.0000 mg | INTRAVENOUS | Status: DC | PRN

## 2021-07-19 MED ORDER — HYDROMORPHONE HCL 1 MG/ML IJ SOLN: 0.5000 mg | INTRAMUSCULAR | Status: DC | PRN

## 2021-07-19 MED ORDER — KETOROLAC TROMETHAMINE 30 MG/ML IJ SOLN
INTRAMUSCULAR | Status: AC
  Filled 2021-07-19: qty 1

## 2021-07-19 MED ORDER — SODIUM CHLORIDE 0.9 % IV SOLN: 2.0000 g | INTRAVENOUS | Status: DC

## 2021-07-19 MED ORDER — ONDANSETRON HCL 4 MG/2ML IJ SOLN
INTRAMUSCULAR | Status: AC
  Filled 2021-07-19: qty 2

## 2021-07-19 MED ORDER — ROCURONIUM BROMIDE 100 MG/10ML IV SOLN
INTRAVENOUS | Status: DC | PRN
  Administered 2021-07-19: 30 mg via INTRAVENOUS

## 2021-07-19 MED ORDER — BUPIVACAINE LIPOSOME 1.3 % IJ SUSP
INTRAMUSCULAR | Status: DC | PRN
  Administered 2021-07-19: 20 mL

## 2021-07-19 MED ORDER — ONDANSETRON HCL 4 MG/2ML IJ SOLN
INTRAMUSCULAR | Status: DC | PRN
  Administered 2021-07-19: 4 mg via INTRAVENOUS

## 2021-07-19 MED ORDER — OXYCODONE HCL 5 MG PO TABS
5.0000 mg | ORAL_TABLET | ORAL | Status: DC | PRN
  Administered 2021-07-19: 10 mg via ORAL
  Filled 2021-07-19: qty 2

## 2021-07-19 MED ORDER — CHLORHEXIDINE GLUCONATE 0.12 % MT SOLN
OROMUCOSAL | Status: AC
Start: 2021-07-19 — End: 2021-07-19
  Administered 2021-07-19: 15 mL
  Filled 2021-07-19: qty 15

## 2021-07-19 MED ORDER — LIDOCAINE HCL (PF) 2 % IJ SOLN
INTRAMUSCULAR | Status: AC
  Filled 2021-07-19: qty 5

## 2021-07-19 MED ORDER — HYDROMORPHONE HCL 1 MG/ML IJ SOLN: 0.2500 mg | INTRAMUSCULAR | Status: DC | PRN

## 2021-07-19 MED ORDER — LIDOCAINE HCL (CARDIAC) PF 100 MG/5ML IV SOSY
PREFILLED_SYRINGE | INTRAVENOUS | Status: DC | PRN
  Administered 2021-07-19: 60 mg via INTRATRACHEAL

## 2021-07-19 MED ORDER — BUPIVACAINE LIPOSOME 1.3 % IJ SUSP
INTRAMUSCULAR | Status: AC
Start: 2021-07-19 — End: ?
  Filled 2021-07-19: qty 20

## 2021-07-19 MED ORDER — SUGAMMADEX SODIUM 200 MG/2ML IV SOLN
INTRAVENOUS | Status: DC | PRN
  Administered 2021-07-19: 259.6 mg via INTRAVENOUS

## 2021-07-19 MED ORDER — SODIUM CHLORIDE 0.9 % IV SOLN: INTRAVENOUS | Status: AC

## 2021-07-19 MED ORDER — SUCCINYLCHOLINE CHLORIDE 200 MG/10ML IV SOSY
PREFILLED_SYRINGE | INTRAVENOUS | Status: DC | PRN
  Administered 2021-07-19: 100 mg via INTRAVENOUS

## 2021-07-19 MED ORDER — SODIUM CHLORIDE 0.9 % IV SOLN
1.0000 g | Freq: Once | INTRAVENOUS | Status: AC
  Administered 2021-07-19: 1 g via INTRAVENOUS
  Filled 2021-07-19: qty 10

## 2021-07-19 MED ORDER — FENTANYL CITRATE (PF) 100 MCG/2ML IJ SOLN
INTRAMUSCULAR | Status: AC
Start: 2021-07-19 — End: ?
  Filled 2021-07-19: qty 2

## 2021-07-19 MED ORDER — PROPOFOL 10 MG/ML IV BOLUS
INTRAVENOUS | Status: DC | PRN
  Administered 2021-07-19: 150 mg via INTRAVENOUS

## 2021-07-19 MED ORDER — FENTANYL CITRATE (PF) 100 MCG/2ML IJ SOLN
INTRAMUSCULAR | Status: AC
  Filled 2021-07-19: qty 2

## 2021-07-19 MED ORDER — ONDANSETRON HCL 4 MG/2ML IJ SOLN
4.0000 mg | Freq: Once | INTRAMUSCULAR | Status: AC
  Administered 2021-07-19: 4 mg via INTRAVENOUS
  Filled 2021-07-19: qty 2

## 2021-07-19 MED ORDER — MIDAZOLAM HCL 2 MG/2ML IJ SOLN
INTRAMUSCULAR | Status: AC
  Filled 2021-07-19: qty 2

## 2021-07-19 MED ORDER — METRONIDAZOLE 500 MG/100ML IV SOLN
500.0000 mg | Freq: Once | INTRAVENOUS | Status: AC
  Administered 2021-07-19: 500 mg via INTRAVENOUS
  Filled 2021-07-19: qty 100

## 2021-07-19 MED ORDER — IOHEXOL 300 MG/ML  SOLN
100.0000 mL | Freq: Once | INTRAMUSCULAR | Status: AC | PRN
  Administered 2021-07-19: 100 mL via INTRAVENOUS

## 2021-07-19 MED ORDER — CHLORHEXIDINE GLUCONATE CLOTH 2 % EX PADS
6.0000 | MEDICATED_PAD | Freq: Once | CUTANEOUS | Status: AC
  Administered 2021-07-19: 6 via TOPICAL

## 2021-07-19 MED ORDER — MORPHINE SULFATE (PF) 4 MG/ML IV SOLN
4.0000 mg | Freq: Once | INTRAVENOUS | Status: AC
  Administered 2021-07-19: 4 mg via INTRAVENOUS
  Filled 2021-07-19: qty 1

## 2021-07-19 SURGICAL SUPPLY — 52 items
BAG RETRIEVAL 10 (BASKET) ×1
BLADE SURG 15 STRL LF DISP TIS (BLADE) ×1 IMPLANT
BLADE SURG 15 STRL SS (BLADE) ×1
CHLORAPREP W/TINT 26 (MISCELLANEOUS) ×2 IMPLANT
CLOTH BEACON ORANGE TIMEOUT ST (SAFETY) ×2 IMPLANT
COVER LIGHT HANDLE STERIS (MISCELLANEOUS) ×4 IMPLANT
CUTTER FLEX LINEAR 45M (STAPLE) ×2 IMPLANT
DRSG TEGADERM 2-3/8X2-3/4 SM (GAUZE/BANDAGES/DRESSINGS) ×4 IMPLANT
ELECT REM PT RETURN 9FT ADLT (ELECTROSURGICAL) ×2
ELECTRODE REM PT RTRN 9FT ADLT (ELECTROSURGICAL) ×1 IMPLANT
GAUZE 4X4 16PLY ~~LOC~~+RFID DBL (SPONGE) ×2 IMPLANT
GLOVE SURG ENC MOIS LTX SZ6.5 (GLOVE) ×2 IMPLANT
GLOVE SURG UNDER POLY LF SZ7 (GLOVE) ×8 IMPLANT
GOWN STRL REUS W/TWL LRG LVL3 (GOWN DISPOSABLE) ×4 IMPLANT
INST SET LAPROSCOPIC AP (KITS) ×2 IMPLANT
KIT TURNOVER KIT A (KITS) ×2 IMPLANT
MANIFOLD NEPTUNE II (INSTRUMENTS) ×2 IMPLANT
NDL HYPO 18GX1.5 BLUNT FILL (NEEDLE) ×1 IMPLANT
NDL HYPO 21X1.5 SAFETY (NEEDLE) ×1 IMPLANT
NDL INSUFFLATION 14GA 120MM (NEEDLE) ×1 IMPLANT
NEEDLE HYPO 18GX1.5 BLUNT FILL (NEEDLE) IMPLANT
NEEDLE HYPO 21X1.5 SAFETY (NEEDLE) ×2 IMPLANT
NEEDLE INSUFFLATION 14GA 120MM (NEEDLE) ×2 IMPLANT
NS IRRIG 1000ML POUR BTL (IV SOLUTION) ×2 IMPLANT
PACK LAP CHOLE LZT030E (CUSTOM PROCEDURE TRAY) ×2 IMPLANT
PAD ARMBOARD 7.5X6 YLW CONV (MISCELLANEOUS) ×2 IMPLANT
PENCIL SMOKE EVACUATOR (MISCELLANEOUS) ×1 IMPLANT
RELOAD 45 VASCULAR/THIN (ENDOMECHANICALS) IMPLANT
RELOAD STAPLE 45 2.5 WHT GRN (ENDOMECHANICALS) IMPLANT
RELOAD STAPLE 45 3.5 BLU ETS (ENDOMECHANICALS) IMPLANT
RELOAD STAPLE TA45 3.5 REG BLU (ENDOMECHANICALS) ×2 IMPLANT
SET BASIN LINEN APH (SET/KITS/TRAYS/PACK) ×2 IMPLANT
SET TUBE IRRIG SUCTION NO TIP (IRRIGATION / IRRIGATOR) IMPLANT
SET TUBE SMOKE EVAC HIGH FLOW (TUBING) ×2 IMPLANT
SHEARS HARMONIC ACE PLUS 36CM (ENDOMECHANICALS) ×2 IMPLANT
SPONGE GAUZE 2X2 8PLY STRL LF (GAUZE/BANDAGES/DRESSINGS) ×3 IMPLANT
STAPLER VISISTAT (STAPLE) ×1 IMPLANT
SUT MNCRL AB 4-0 PS2 18 (SUTURE) ×4 IMPLANT
SUT VICRYL 0 UR6 27IN ABS (SUTURE) ×2 IMPLANT
SYR 20ML LL LF (SYRINGE) ×3 IMPLANT
SYS BAG RETRIEVAL 10MM (BASKET) ×1
SYSTEM BAG RETRIEVAL 10MM (BASKET) ×1 IMPLANT
TRAY FOL W/BAG SLVR 16FR STRL (SET/KITS/TRAYS/PACK) IMPLANT
TRAY FOLEY W/BAG SLVR 16FR (SET/KITS/TRAYS/PACK) ×1
TRAY FOLEY W/BAG SLVR 16FR LF (SET/KITS/TRAYS/PACK) ×1
TRAY FOLEY W/BAG SLVR 16FR ST (SET/KITS/TRAYS/PACK) ×1 IMPLANT
TROCAR ENDO BLADELESS 11MM (ENDOMECHANICALS) ×2 IMPLANT
TROCAR ENDO BLADELESS 12MM (ENDOMECHANICALS) ×2 IMPLANT
TROCAR XCEL NON-BLD 5MMX100MML (ENDOMECHANICALS) ×2 IMPLANT
TUBE CONNECTING 12X1/4 (SUCTIONS) ×2 IMPLANT
WARMER LAPAROSCOPE (MISCELLANEOUS) ×2 IMPLANT
YANKAUER SUCT BULB TIP 10FT TU (MISCELLANEOUS) ×1 IMPLANT

## 2021-07-19 NOTE — Anesthesia Preprocedure Evaluation (Signed)
Anesthesia Evaluation  Patient identified by MRN, date of birth, ID band Patient awake    Reviewed: Allergy & Precautions, NPO status , Patient's Chart, lab work & pertinent test results  Airway Mallampati: II  TM Distance: >3 FB Neck ROM: Full    Dental  (+) Dental Advisory Given   Pulmonary neg pulmonary ROS,    Pulmonary exam normal breath sounds clear to auscultation       Cardiovascular negative cardio ROS Normal cardiovascular exam Rhythm:Regular Rate:Normal     Neuro/Psych negative neurological ROS  negative psych ROS   GI/Hepatic negative GI ROS, Neg liver ROS,   Endo/Other  diabetes  Renal/GU negative Renal ROS  negative genitourinary   Musculoskeletal negative musculoskeletal ROS (+)   Abdominal   Peds negative pediatric ROS (+)  Hematology negative hematology ROS (+)   Anesthesia Other Findings   Reproductive/Obstetrics negative OB ROS                             Anesthesia Physical Anesthesia Plan  ASA: 2  Anesthesia Plan: General   Post-op Pain Management: Dilaudid IV   Induction: Intravenous and Rapid sequence  PONV Risk Score and Plan: 4 or greater and Ondansetron, Dexamethasone and Midazolam  Airway Management Planned: Oral ETT  Additional Equipment:   Intra-op Plan:   Post-operative Plan: Extubation in OR  Informed Consent: I have reviewed the patients History and Physical, chart, labs and discussed the procedure including the risks, benefits and alternatives for the proposed anesthesia with the patient or authorized representative who has indicated his/her understanding and acceptance.     Dental advisory given  Plan Discussed with: CRNA and Surgeon  Anesthesia Plan Comments:         Anesthesia Quick Evaluation

## 2021-07-19 NOTE — ED Provider Notes (Signed)
[facility] Provider Note   CSN: [id number] Arrival date & time: 07/18/21  2054     History  Chief Complaint  Patient presents with   Abdominal Pain    Jean Aguirre is a 46 y.o. female here with abdominal pain.  Onset 3 weeks ago, waxing and waning but became very intense in past 24 hours.  Entire abdomen tender. Nausea/vomiting No fevers.  Here with niece at bedside who helps translate per their preference  HPI     Home Medications Prior to Admission medications   Medication Sig Start Date End Date Taking? Authorizing Provider  ibuprofen (ADVIL,MOTRIN) 600 MG tablet Take 1 tablet (600 mg total) by mouth every 6 (six) hours as needed for fever, headache, mild pain, moderate pain or cramping. 03/08/15   Will Bonnet, MD  oxyCODONE-acetaminophen (PERCOCET/ROXICET) 5-325 MG tablet Take 1-2 tablets by mouth every 6 (six) hours as needed for moderate pain or severe pain. 03/08/15   Will Bonnet, MD  Prenatal Vit-Fe Fumarate-FA (PRENATAL VITAMIN PO) Take 1 tablet by mouth daily.    [provider]  tamsulosin (FLOMAX) 0.4 MG CAPS capsule Take by mouth. 11/30/14   [provider]  valACYclovir (VALTREX) 1000 MG tablet Take 500 mg by mouth 2 (two) times daily.    [provider]      Allergies    Pineapple    Review of Systems   Review of Systems  Physical Exam Updated Vital Signs BP 102/60 (BP Location: Right Arm)    Pulse 87    Temp (!) 97.5 F (36.4 C) (Oral)    Resp 16    Ht 5' (1.524 m)    Wt 64.9 kg    LMP 06/28/2021    SpO2 100%    BMI 27.94 kg/m  Physical Exam Constitutional:      General: She is not in acute distress. HENT:     Head: Normocephalic and atraumatic.  Eyes:     Conjunctiva/sclera: Conjunctivae normal.     Pupils: Pupils are equal, round, and reactive to light.  Cardiovascular:     Rate and Rhythm: Normal rate and regular rhythm.  Pulmonary:     Effort: Pulmonary effort  is normal. No respiratory distress.  Abdominal:     General: There is no distension.     Tenderness: There is generalized abdominal tenderness. There is guarding and rebound.  Skin:    General: Skin is warm and dry.  Neurological:     General: No focal deficit present.     Mental Status: She is alert. Mental status is at baseline.  Psychiatric:        Mood and Affect: Mood normal.        Behavior: Behavior normal.    ED Results / Procedures / Treatments   Labs (all labs ordered are listed, but only abnormal results are displayed) Labs Reviewed  COMPREHENSIVE METABOLIC PANEL - Abnormal; Notable for the following components:      Result Value   Sodium 132 (*)    Glucose, Bld 119 (*)    Calcium 8.6 (*)    Anion gap 3 (*)    All other components within normal limits  CBC - Abnormal; Notable for the following components:   WBC 11.7 (*)    All other components within normal limits  URINALYSIS, ROUTINE W REFLEX MICROSCOPIC - Abnormal; Notable for the following components:   Color, Urine STRAW (*)    Ketones, ur 5 (*)  All other components within normal limits  COMPREHENSIVE METABOLIC PANEL - Abnormal; Notable for the following components:   Glucose, Bld 111 (*)    Calcium 8.3 (*)    Total Protein 6.2 (*)    Albumin 3.4 (*)    All other components within normal limits  CBC - Abnormal; Notable for the following components:   WBC 13.1 (*)    Hemoglobin 11.3 (*)    HCT 35.1 (*)    All other components within normal limits  RESP PANEL BY RT-PCR (FLU A&B, COVID) ARPGX2  CULTURE, BLOOD (ROUTINE X 2)  CULTURE, BLOOD (ROUTINE X 2)  SURGICAL PCR SCREEN  LIPASE, BLOOD  HCG, SERUM, QUALITATIVE  MAGNESIUM  PHOSPHORUS  HIV ANTIBODY (ROUTINE TESTING W REFLEX)    EKG EKG Interpretation  Date/Time:  Friday July 18 2021 22:12:13 EST Ventricular Rate:  79 PR Interval:  151 QRS Duration: 78 QT Interval:  401 QTC Calculation: 460 R Axis:   37 Text Interpretation: Sinus  rhythm Borderline T abnormalities, anterior leads Baseline wander in lead(s) V2 Confirmed by Octaviano Glow [phone]) on 07/18/2021 10:31:30 PM  Radiology CT ABDOMEN PELVIS W CONTRAST  Result Date: 07/19/2021 CLINICAL DATA:  LLQ abdominal pain diffuse abdominal pain worsening 24 hours, diffuse tenderness on exam - evaluate for appendicitis, biliary disease, colitis EXAM: CT ABDOMEN AND PELVIS WITH CONTRAST TECHNIQUE: Multidetector CT imaging of the abdomen and pelvis was performed using the standard protocol following bolus administration of intravenous contrast. RADIATION DOSE REDUCTION: This exam was performed according to the departmental dose-optimization program which includes automated exposure control, adjustment of the mA and/or kV according to patient size and/or use of iterative reconstruction technique. CONTRAST:  114mL OMNIPAQUE IOHEXOL 300 MG/ML  SOLN COMPARISON:  None. FINDINGS: Lower chest: No acute abnormality. Hepatobiliary: No focal hepatic abnormality. Gallbladder unremarkable. Pancreas: No focal abnormality or ductal dilatation. Spleen: No focal abnormality.  Normal size. Adrenals/Urinary Tract: No adrenal abnormality. No focal renal abnormality. No stones or hydronephrosis. Urinary bladder is unremarkable. Stomach/Bowel: Stomach, large and small bowel grossly unremarkable. The appendix is dilated, measuring 13 mm, extending medially from the cecum towards the midline. Appearance is concerning for acute appendicitis. Vascular/Lymphatic: No evidence of aneurysm or adenopathy. Reproductive: Uterus and right ovary unremarkable. 2.7 cm cyst or follicle in the left ovary. Other: Small amount of free fluid in the cul-de-sac.  No free air. Musculoskeletal: No acute bony abnormality. IMPRESSION: Appendix is dilated measuring up to 13 mm concerning for acute appendicitis. Electronically Signed   By: Rolm Baptise M.D.   On: 07/19/2021 02:23    Procedures Procedures    Medications Ordered in  ED Medications  HYDROmorphone (DILAUDID) injection 0.5 mg (has no administration in time range)  0.9 %  sodium chloride infusion ( Intravenous New Bag/Given 07/19/21 0616)  piperacillin-tazobactam (ZOSYN) IVPB 3.375 g (3.375 g Intravenous New Bag/Given 07/19/21 0637)  ondansetron (ZOFRAN) injection 4 mg (has no administration in time range)  Chlorhexidine Gluconate Cloth 2 % PADS 6 each (has no administration in time range)  cefoTEtan (CEFOTAN) 2 g in sodium chloride 0.9 % 100 mL IVPB (has no administration in time range)  sodium chloride 0.9 % bolus 1,000 mL (0 mLs Intravenous Stopped 07/18/21 2304)  morphine (PF) 4 MG/ML injection 4 mg (4 mg Intravenous Given 07/18/21 2301)  ondansetron (ZOFRAN) injection 4 mg (4 mg Intravenous Given 07/18/21 2301)  ketorolac (TORADOL) 30 MG/ML injection 30 mg (30 mg Intravenous Given 07/18/21 2301)  iohexol (OMNIPAQUE) 300 MG/ML solution 100 mL (100  mLs Intravenous Contrast Given 07/19/21 0154)  ondansetron (ZOFRAN) injection 4 mg (4 mg Intravenous Given 07/19/21 0308)  morphine (PF) 4 MG/ML injection 4 mg (4 mg Intravenous Given 07/19/21 0308)  cefTRIAXone (ROCEPHIN) 1 g in sodium chloride 0.9 % 100 mL IVPB (1 g Intravenous New Bag/Given 07/19/21 0308)  metroNIDAZOLE (FLAGYL) IVPB 500 mg (500 mg Intravenous New Bag/Given 07/19/21 0309)    ED Course/ Medical Decision Making/ A&P                           Medical Decision Making Amount and/or Complexity of Data Reviewed Labs: ordered. Radiology: ordered.  Risk Prescription drug management. Decision regarding hospitalization.   This patient presents to the Emergency Department with complaint of abdominal pain. This involves an extensive number of treatment options, and is a complaint that carries with it a high risk of complications and morbidity.  The differential diagnosis includes, but is not limited to, gastritis vs biliary disease vs appendicitis vs peptic ulcer vs constipation vs colitis vs UTI vs  other  I ordered, reviewed, and interpreted labs, including BMP and CBC.  There were no immediate, life-threatening emergencies found in this labwork.  Patient's UA showed no signs of infection.  WBC minorly elevated I ordered medication IV morphine, IV zofran for abdominal pain and/or nausea I ordered imaging studies which included CT abdomen pelvis CT abd pending at the time of handoff to EDP Dr Stark Jock. The monitor tracing which showed NSR Additional history was obtained from niece  After the interventions stated above, I reevaluated the patient and found that they remained clinically stable.  Hand off to Dr Stark Jock pending CT abdomen        Final Clinical Impression(s) / ED Diagnoses Final diagnoses:  Acute appendicitis, unspecified acute appendicitis type    Rx / DC Orders ED Discharge Orders     None         Langston Masker Carola Rhine, MD 07/19/21 1000

## 2021-07-19 NOTE — Transfer of Care (Signed)
Immediate Anesthesia Transfer of Care Note  Patient: Jean Aguirre  Procedure(s) Performed: APPENDECTOMY LAPAROSCOPIC (Abdomen)  Patient Location: PACU  Anesthesia Type:General  Level of Consciousness: awake, alert , sedated and drowsy  Airway & Oxygen Therapy: Patient Spontanous Breathing and Patient connected to nasal cannula oxygen  Post-op Assessment: Report given to RN and Post -op Vital signs reviewed and stable  Post vital signs: Reviewed and stable  Last Vitals:  Vitals Value Taken Time  BP 104/61 07/19/21 1245  Temp 97.8   Pulse 84 07/19/21 1249  Resp 19 07/19/21 1249  SpO2 99 % 07/19/21 1249  Vitals shown include unvalidated device data.  Last Pain:  Vitals:   07/19/21 0504  TempSrc:   PainSc: 8          Complications: No notable events documented.

## 2021-07-19 NOTE — ED Provider Notes (Signed)
°  Physical Exam  BP 117/68    Pulse 85    Temp 97.7 F (36.5 C)    Resp 18    Ht 5' (1.524 m)    Wt 65.8 kg    LMP 06/28/2021    SpO2 96%    BMI 28.32 kg/m   Physical Exam Vitals and nursing note reviewed.  Constitutional:      General: She is not in acute distress.    Appearance: She is well-developed. She is not diaphoretic.  HENT:     Head: Normocephalic and atraumatic.  Cardiovascular:     Rate and Rhythm: Normal rate and regular rhythm.     Heart sounds: No murmur heard.   No friction rub. No gallop.  Pulmonary:     Effort: Pulmonary effort is normal. No respiratory distress.     Breath sounds: Normal breath sounds. No wheezing.  Abdominal:     General: Bowel sounds are normal. There is no distension.     Palpations: Abdomen is soft.     Tenderness: There is abdominal tenderness in the right lower quadrant, periumbilical area, suprapubic area and left lower quadrant. There is no right CVA tenderness, left CVA tenderness, guarding or rebound.  Musculoskeletal:        General: Normal range of motion.     Cervical back: Normal range of motion and neck supple.  Skin:    General: Skin is warm and dry.  Neurological:     General: No focal deficit present.     Mental Status: She is alert and oriented to person, place, and time.    Procedures  Procedures  ED Course / MDM    Care assumed from Dr. Langston Masker at shift change.  Patient presenting here with generalized abdominal pain that has worsened over the past 2 days.  Care signed out to me awaiting results of a CT scan.  Thus far she does have a mild leukocytosis with white count of 12,000, but otherwise unremarkable laboratory studies.  CT scan has returned and shows a 13 mm, thickened appendix consistent with acute appendicitis.  This finding was discussed with Dr. Constance Haw from general surgery.  Patient has been given Rocephin and Flagyl.  She will be admitted to the hospitalist service, then undergo appendectomy in the  morning.       Veryl Speak, MD 07/19/21 [phone]

## 2021-07-19 NOTE — Op Note (Signed)
[facility]  Date of Surgery: 07/18/2021 - 07/19/2021  Admit Date: 07/18/2021   Performing Service: General  Surgeon(s) and Role:    Lucretia Roers, MD - Primary   Pre-operative Diagnosis: Acute Appendicitis  Post-operative Diagnosis: Acute Perforated Appendicitis  Procedure Performed: Laparoscopic Appendectomy   Surgeon: Leatrice Jewels. Henreitta Leber, MD   Assistant: No qualified resident was available.   Anesthesia: General   Findings:  Upon entering the abdomen (organ space), I encountered purulent ascites in the right lower quadrant.   The appendix was found to be inflamed. There were not signs of necrosis. There was perforation. There was not abscess formation.   Estimated Blood Loss: Minimal   Specimens:  ID Type Source Tests Collected by Time Destination  1 : Appendix Tissue PATH Appendix SURGICAL PATHOLOGY Lucretia Roers, MD 07/19/2021 1028      Complications: None; patient tolerated the procedure well.   Disposition: PACU - hemodynamically stable.   Condition: stable   Indications: The patient presented with a 2 day history of right-sided abdominal pain. A CT revealed findings consistent with acute appendicitis.   Procedure Details  Prior to the procedure, the risks, benefits, complications, treatment options, and expected outcomes were discussed with the patient and/or family, including but not limited to the risk of bleeding, infection, finding of a normal appendix, and the need for conversion to an open procedure. There was concurrence with the proposed plan and informed consent was obtained. The patient was taken to the operating room, identified as Jean Aguirre and the procedure verified as Laproscopic Appendectomy.    The patient was placed in the supine position and general anesthesia was induced, along with placement of orogastric tube, SCD's, and a Foley catheter. The abdomen was prepped and draped in a sterile fashion. The  abdomen was entered with Veress technique in the infraumbilical incision. Intraperitoneal placement was confirmed with saline drop, low entry pressures, and easy insufflation. A 11 mm optiview trocar was placed under direct visualization with a 0 degree scope. The 10 mm 0 degree scope was placed in the abdomen and no evidence of injury was identified. A 12 mm port was placed in the left lower quadrant of the abdomen after skin incision with trocar placement under direct vision. A careful evaluation of the entire abdomen was carried out. An additional 5 mm port was placed in the suprapubic area under direct vision.  Minor omental adhesions were taken down from the midline with the harmonic scalpel. The patient was placed in Trendelenburg and left lateral decubitus position. The small intestines were retracted in the cephalad and left lateral direction away from the pelvis and right lower quadrant. There was purulent ascites in the right lower quadrant. The patient was found to have an perforated dilated appendix. There was evidence of perforation at the tip.   The appendix was carefully dissected. A window was made in the mesoappendix at the base of the appendix. The appendix was divided at its base using a standard endo-GIA stapler. Minimal appendiceal stump was left in place. The mesoappendix was taken with the harmonic energy device. The appendix was placed within an Endocatch specimen bag. There was no evidence of bleeding, leakage, or complication after division of the appendix.  Any remaining blood or pus was suctioned out from the abdomen, hemostasis was confirmed. The endocatch bag was removed via the 12 mm port, then the abdomen desufflated. The appendix was passed off the field as a specimen.   The the 12 mm  and 10 mm port sites were closed with a 0 Vicryl suture. The trocar site skin wounds were closed using staples and gauze and tape were placed on the port sites. The patient was then awakened from  general anesthesia, extubated, and taken to PACU for recovery.   Instrument, sponge, and needle counts were correct at the conclusion of the case.   Algis Greenhouse, MD [facility] [address] [place], [place] [address] [phone](WHQPRF)

## 2021-07-19 NOTE — Plan of Care (Signed)
  Problem: Education: Goal: Knowledge of General Education information will improve Description Including pain rating scale, medication(s)/side effects and non-pharmacologic comfort measures Outcome: Progressing   

## 2021-07-19 NOTE — Anesthesia Postprocedure Evaluation (Signed)
Anesthesia Post Note  Patient: Dallas Breeding  Procedure(s) Performed: APPENDECTOMY LAPAROSCOPIC (Abdomen)  Patient location during evaluation: PACU Anesthesia Type: General Level of consciousness: awake and alert and oriented Pain management: pain level controlled Vital Signs Assessment: post-procedure vital signs reviewed and stable Respiratory status: spontaneous breathing, nonlabored ventilation, respiratory function stable and patient connected to nasal cannula oxygen Cardiovascular status: blood pressure returned to baseline and stable Postop Assessment: no apparent nausea or vomiting Anesthetic complications: no   No notable events documented.   Last Vitals:  Vitals:   07/19/21 0501 07/19/21 1243  BP: 102/60 110/60  Pulse: 87 87  Resp: 16 16  Temp: (!) 36.4 C 36.6 C  SpO2: 100% 99%    Last Pain:  Vitals:   07/19/21 1245  TempSrc:   PainSc: Asleep                 Hanford Lust C Hearl Heikes

## 2021-07-19 NOTE — ED Notes (Signed)
Patient transported to CT 

## 2021-07-19 NOTE — Assessment & Plan Note (Signed)
This is possibly reactive vs infectious Continue treatment as described for acute appendicitis

## 2021-07-19 NOTE — Anesthesia Procedure Notes (Addendum)
Procedure Name: Intubation Date/Time: 07/19/2021 11:43 AM Performed by: Denese Killings, MD Pre-anesthesia Checklist: Patient identified, Emergency Drugs available, Suction available and Patient being monitored Patient Re-evaluated:Patient Re-evaluated prior to induction Oxygen Delivery Method: Circle system utilized Preoxygenation: Pre-oxygenation with 100% oxygen Induction Type: IV induction and Rapid sequence Laryngoscope Size: Mac and 3 Tube type: Oral Tube size: 7.0 mm Number of attempts: 1 Airway Equipment and Method: Stylet and Oral airway Placement Confirmation: ETT inserted through vocal cords under direct vision, positive ETCO2 and breath sounds checked- equal and bilateral Secured at: 20 (at lip) cm Tube secured with: Tape Dental Injury: Teeth and Oropharynx as per pre-operative assessment

## 2021-07-19 NOTE — Consult Note (Signed)
[facility] Consult  Reason for Consult: Acute appendicitis  Referring Physician:  Dr. Stark Jock ED   Chief Complaint   Abdominal Pain     HPI: Jean Aguirre is a 46 y.o. female with acute onset of lower abdominal pain for 48 hours prior to arriving to the hospital and associated nausea but no vomiting. She is otherwise healthy. She had 10/10 pain that was first in her umbilical area and then became more generalized. She did not describe any fevers.  CT demonstrated a dilated appendix and she had a mild leukocytosis.   She is spanish speaking and wanted to use her niece as her interpretor.   Past Medical History:  Diagnosis Date   AMA (advanced maternal age) multigravida 35+    Cervical dysplasia    Diabetes mellitus without complication ([place])    Gestational   Genital herpes     Past Surgical History:  Procedure Laterality Date   CESAREAN SECTION  2010   breech presentation   CESAREAN SECTION N/A 03/05/2015   Procedure: CESAREAN SECTION;  Surgeon: Aletha Halim, MD;  Location: [facility] ORS;  Service: Obstetrics;  Laterality: N/A;   LEEP     CIN II-III    Family History  Problem Relation Age of Onset   Diabetes Mother    Hypertension Mother    Breast cancer Sister        deceased from CA at 13y/o    Social History   Tobacco Use   Smoking status: Never   Smokeless tobacco: Never  Vaping Use   Vaping Use: Never used  Substance Use Topics   Alcohol use: No   Drug use: No    Medications: I have reviewed the patient's current medications. Prior to Admission:  Medications Prior to Admission  Medication Sig Dispense Refill Last Dose   ibuprofen (ADVIL,MOTRIN) 600 MG tablet Take 1 tablet (600 mg total) by mouth every 6 (six) hours as needed for fever, headache, mild pain, moderate pain or cramping. 30 tablet 0    oxyCODONE-acetaminophen (PERCOCET/ROXICET) 5-325 MG tablet Take 1-2 tablets by mouth every 6 (six) hours as needed for moderate  pain or severe pain. 30 tablet 0    Prenatal Vit-Fe Fumarate-FA (PRENATAL VITAMIN PO) Take 1 tablet by mouth daily.      tamsulosin (FLOMAX) 0.4 MG CAPS capsule Take by mouth.      valACYclovir (VALTREX) 1000 MG tablet Take 500 mg by mouth 2 (two) times daily.      Scheduled:  Chlorhexidine Gluconate Cloth  6 each Topical Once   Continuous:  sodium chloride 75 mL/hr at 07/19/21 0616   cefoTEtan (CEFOTAN) IV     piperacillin-tazobactam 3.375 g (07/19/21 [id number])   [id number] (DILAUDID) injection, ondansetron (ZOFRAN) IV  Allergies  Allergen Reactions   Pineapple Itching    Throat itches     ROS:  A comprehensive review of systems was negative except for: Gastrointestinal: positive for abdominal pain and nausea  Blood pressure 102/60, pulse 87, temperature (!) 97.5 F (36.4 C), temperature source Oral, resp. rate 16, height 5' (1.524 m), weight 64.9 kg, last menstrual period 06/28/2021, SpO2 100 %, unknown if currently breastfeeding. Physical Exam Vitals reviewed.  Constitutional:      Appearance: She is well-developed.  HENT:     Head: Normocephalic.  Eyes:     Extraocular Movements: Extraocular movements intact.  Cardiovascular:     Rate and Rhythm: Tachycardia present.  Pulmonary:     Effort: Pulmonary effort is normal.  Abdominal:  General: There is distension.     Palpations: Abdomen is soft.     Tenderness: There is abdominal tenderness in the right lower quadrant and suprapubic area.  Skin:    General: Skin is warm.  Neurological:     General: No focal deficit present.     Mental Status: She is alert and oriented to person, place, and time.    Results: Results for orders placed or performed during the hospital encounter of 07/18/21 (from the past 48 hour(s))  Urinalysis, Routine w reflex microscopic Urine, Clean Catch     Status: Abnormal   Collection Time: 07/18/21  9:44 PM  Result Value Ref Range   Color, Urine STRAW (A) YELLOW   APPearance CLEAR  CLEAR   Specific Gravity, Urine 1.005 1.005 - 1.030   pH 7.0 5.0 - 8.0   Glucose, UA NEGATIVE NEGATIVE mg/dL   Hgb urine dipstick NEGATIVE NEGATIVE   Bilirubin Urine NEGATIVE NEGATIVE   Ketones, ur 5 (A) NEGATIVE mg/dL   Protein, ur NEGATIVE NEGATIVE mg/dL   Nitrite NEGATIVE NEGATIVE   Leukocytes,Ua NEGATIVE NEGATIVE    Comment: Performed at [facility], [address] [place], [place] [address]  Lipase, blood     Status: None   Collection Time: 07/18/21  9:55 PM  Result Value Ref Range   Lipase 28 11 - 51 U/L    Comment: Performed at [facility], [address], [place], [place] [address]  Comprehensive metabolic panel     Status: Abnormal   Collection Time: 07/18/21  9:55 PM  Result Value Ref Range   Sodium 132 (L) 135 - 145 mmol/L   Potassium 3.7 3.5 - 5.1 mmol/L   Chloride 103 98 - 111 mmol/L   CO2 26 22 - 32 mmol/L   Glucose, Bld 119 (H) 70 - 99 mg/dL    Comment: Glucose reference range applies only to samples taken after fasting for at least 8 hours.   BUN 15 6 - 20 mg/dL   Creatinine, Ser 0.77 0.44 - 1.00 mg/dL   Calcium 8.6 (L) 8.9 - 10.3 mg/dL   Total Protein 7.1 6.5 - 8.1 g/dL   Albumin 4.1 3.5 - 5.0 g/dL   AST 22 15 - 41 U/L   ALT 16 0 - 44 U/L   Alkaline Phosphatase 63 38 - 126 U/L   Total Bilirubin 0.5 0.3 - 1.2 mg/dL   GFR, Estimated >60 >60 mL/min    Comment: (NOTE) Calculated using the CKD-EPI Creatinine Equation (2021)    Anion gap 3 (L) 5 - 15    Comment: Performed at [facility], [address], [place], [place] [address]  CBC     Status: Abnormal   Collection Time: 07/18/21  9:55 PM  Result Value Ref Range   WBC 11.7 (H) 4.0 - 10.5 K/uL   RBC 4.52 3.87 - 5.11 MIL/uL   Hemoglobin 13.2 12.0 - 15.0 g/dL   HCT 39.8 36.0 - 46.0 %   MCV 88.1 80.0 - 100.0 fL   MCH 29.2 26.0 - 34.0 pg   MCHC 33.2 30.0 - 36.0 g/dL   RDW 13.1 11.5 - 15.5 %   Platelets 228 150 - 400 K/uL   nRBC 0.0 0.0 - 0.2 %    Comment: Performed at [facility], [address],  [place], [place] [address]  hCG, serum, qualitative     Status: None   Collection Time: 07/18/21  9:55 PM  Result Value Ref Range   Preg, Serum NEGATIVE  NEGATIVE    Comment:        THE SENSITIVITY OF THIS METHODOLOGY IS >10 mIU/mL. Performed at [facility], [address] [place], [place] [address]   Resp Panel by RT-PCR (Flu A&B, Covid) Nasopharyngeal Swab     Status: None   Collection Time: 07/19/21  2:54 AM   Specimen: Nasopharyngeal Swab; Nasopharyngeal(NP) swabs in vial transport medium  Result Value Ref Range   SARS Coronavirus 2 by RT PCR NEGATIVE NEGATIVE    Comment: (NOTE) SARS-CoV-2 target nucleic acids are NOT DETECTED.  The SARS-CoV-2 RNA is generally detectable in upper respiratory specimens during the acute phase of infection. The lowest concentration of SARS-CoV-2 viral copies this assay can detect is 138 copies/mL. A negative result does not preclude SARS-Cov-2 infection and should not be used as the sole basis for treatment or other patient management decisions. A negative result may occur with  improper specimen collection/handling, submission of specimen other than nasopharyngeal swab, presence of viral mutation(s) within the areas targeted by this assay, and inadequate number of viral copies(<138 copies/mL). A negative result must be combined with clinical observations, patient history, and epidemiological information. The expected result is Negative.  Fact Sheet for Patients:  EntrepreneurPulse.com.au  Fact Sheet for Healthcare Providers:  IncredibleEmployment.be  This test is no t yet approved or cleared by the [place] FDA and  has been authorized for detection and/or diagnosis of SARS-CoV-2 by FDA under an Emergency Use Authorization (EUA). This EUA will remain  in effect (meaning this test can be used) for the duration of the COVID-19 declaration under Section 564(b)(1) of the Act, 21 U.S.C.section 360bbb-3(b)(1),  unless the authorization is terminated  or revoked sooner.       Influenza A by PCR NEGATIVE NEGATIVE   Influenza B by PCR NEGATIVE NEGATIVE    Comment: (NOTE) The Xpert Xpress SARS-CoV-2/FLU/RSV plus assay is intended as an aid in the diagnosis of influenza from Nasopharyngeal swab specimens and should not be used as a sole basis for treatment. Nasal washings and aspirates are unacceptable for Xpert Xpress SARS-CoV-2/FLU/RSV testing.  Fact Sheet for Patients: EntrepreneurPulse.com.au  Fact Sheet for Healthcare Providers: IncredibleEmployment.be  This test is not yet approved or cleared by the [place] FDA and has been authorized for detection and/or diagnosis of SARS-CoV-2 by FDA under an Emergency Use Authorization (EUA). This EUA will remain in effect (meaning this test can be used) for the duration of the COVID-19 declaration under Section 564(b)(1) of the Act, 21 U.S.C. section 360bbb-3(b)(1), unless the authorization is terminated or revoked.  Performed at [facility], [address] [place], [place] [address]   Culture, blood (Routine X 2) w Reflex to ID Panel     Status: None (Preliminary result)   Collection Time: 07/19/21  5:54 AM   Specimen: BLOOD  Result Value Ref Range   Specimen Description BLOOD    Special Requests NONE    Culture      NO GROWTH <12 HOURS Performed at [facility], [address] [place], [place] [address]    Report Status PENDING   Comprehensive metabolic panel     Status: Abnormal   Collection Time: 07/19/21  5:55 AM  Result Value Ref Range   Sodium 138 135 - 145 mmol/L   Potassium 3.9 3.5 - 5.1 mmol/L   Chloride 108 98 - 111 mmol/L   CO2 23 22 - 32 mmol/L   Glucose, Bld 111 (H) 70 - 99 mg/dL    Comment: Glucose  reference range applies only to samples taken after fasting for at least 8 hours.   BUN 12 6 - 20 mg/dL   Creatinine, Ser 0.62 0.44 - 1.00 mg/dL   Calcium 8.3 (L) 8.9 - 10.3 mg/dL    Total Protein 6.2 (L) 6.5 - 8.1 g/dL   Albumin 3.4 (L) 3.5 - 5.0 g/dL   AST 18 15 - 41 U/L   ALT 15 0 - 44 U/L   Alkaline Phosphatase 52 38 - 126 U/L   Total Bilirubin 0.9 0.3 - 1.2 mg/dL   GFR, Estimated >60 >60 mL/min    Comment: (NOTE) Calculated using the CKD-EPI Creatinine Equation (2021)    Anion gap 7 5 - 15    Comment: Performed at [facility], [address], [place], [place] [address]  CBC     Status: Abnormal   Collection Time: 07/19/21  5:55 AM  Result Value Ref Range   WBC 13.1 (H) 4.0 - 10.5 K/uL   RBC 4.04 3.87 - 5.11 MIL/uL   Hemoglobin 11.3 (L) 12.0 - 15.0 g/dL   HCT 35.1 (L) 36.0 - 46.0 %   MCV 86.9 80.0 - 100.0 fL   MCH 28.0 26.0 - 34.0 pg   MCHC 32.2 30.0 - 36.0 g/dL   RDW 13.1 11.5 - 15.5 %   Platelets 197 150 - 400 K/uL   nRBC 0.0 0.0 - 0.2 %    Comment: Performed at [facility], [address], [place], [place] [address]  Magnesium     Status: None   Collection Time: 07/19/21  5:55 AM  Result Value Ref Range   Magnesium 2.1 1.7 - 2.4 mg/dL    Comment: Performed at [facility], [address] [place], [place] [address]  Phosphorus     Status: None   Collection Time: 07/19/21  5:55 AM  Result Value Ref Range   Phosphorus 3.6 2.5 - 4.6 mg/dL    Comment: Performed at [facility], [address], [place], [place] [address]  Culture, blood (Routine X 2) w Reflex to ID Panel     Status: None (Preliminary result)   Collection Time: 07/19/21  6:00 AM   Specimen: BLOOD  Result Value Ref Range   Specimen Description BLOOD    Special Requests NONE    Culture      NO GROWTH <12 HOURS Performed at [facility], [address] [place], [place] [address]    Report Status PENDING    Personally reviewed Dilated appendix with inflammation CT ABDOMEN PELVIS W CONTRAST  Result Date: 07/19/2021 CLINICAL DATA:  LLQ abdominal pain diffuse abdominal pain worsening 24 hours, diffuse tenderness on exam - evaluate for appendicitis, biliary disease, colitis EXAM: CT  ABDOMEN AND PELVIS WITH CONTRAST TECHNIQUE: Multidetector CT imaging of the abdomen and pelvis was performed using the standard protocol following bolus administration of intravenous contrast. RADIATION DOSE REDUCTION: This exam was performed according to the departmental dose-optimization program which includes automated exposure control, adjustment of the mA and/or kV according to patient size and/or use of iterative reconstruction technique. CONTRAST:  181mL OMNIPAQUE IOHEXOL 300 MG/ML  SOLN COMPARISON:  None. FINDINGS: Lower chest: No acute abnormality. Hepatobiliary: No focal hepatic abnormality. Gallbladder unremarkable. Pancreas: No focal abnormality or ductal dilatation. Spleen: No focal abnormality.  Normal size. Adrenals/Urinary Tract: No adrenal abnormality. No focal renal abnormality. No stones or hydronephrosis. Urinary bladder is unremarkable. Stomach/Bowel: Stomach, large and small bowel grossly unremarkable. The appendix is dilated, measuring 13 mm, extending medially from the  cecum towards the midline. Appearance is concerning for acute appendicitis. Vascular/Lymphatic: No evidence of aneurysm or adenopathy. Reproductive: Uterus and right ovary unremarkable. 2.7 cm cyst or follicle in the left ovary. Other: Small amount of free fluid in the cul-de-sac.  No free air. Musculoskeletal: No acute bony abnormality. IMPRESSION: Appendix is dilated measuring up to 13 mm concerning for acute appendicitis. Electronically Signed   By: Rolm Baptise M.D.   On: 07/19/2021 02:23     Assessment & Plan:  Jean Aguirre is a 46 y.o. female with acute appendicitis.  -Discussed the risk of laparoscopic appendectomy and the option of antibiotics alone. Discussed that in [place] and some trials in the [place], antibiotics are used for simple appendicitis. Discussed that research shows a 40% failure rate for antibiotics alone.  Discussed risk of surgery including but not limited to bleeding, infection, injury  to other organs, normal appendix, and after this discussion the patient has decided to proceed with surgery.    All questions were answered to the satisfaction of the patient and family.  [phone]- April Holding 07/19/2021, 7:50 AM

## 2021-07-19 NOTE — Assessment & Plan Note (Signed)
Continue IV Dilaudid as needed Continue management as described for acute appendicitis

## 2021-07-19 NOTE — Assessment & Plan Note (Signed)
Na 132, continue IV hydration

## 2021-07-19 NOTE — Progress Notes (Signed)
[facility]  Lap appy completed. Appendix had perforated. So will keep overnight and ensure that her pain is ok and able to tolerated a diet.   [phone]- Valarie Merino her niece was updated.  Algis Greenhouse, MD [facility] [address] [place], [place] [address] [phone] (office)

## 2021-07-19 NOTE — H&P (Signed)
History and Physical    Patient: Jean Aguirre OIZ:[id number] DOB: [birth year] DOA: 07/18/2021 DOS: the patient was seen and examined on 07/19/2021 PCP: Sheila Oats, MD (Inactive)  Patient coming from: Home  Chief Complaint:  Chief Complaint  Patient presents with   Abdominal Pain    HPI: Jean Aguirre is a 46 y.o. female with medical history significant of genital herpes who presents to the emergency department accompanied by daughter due to 2-day onset of generalized abdominal pain associated with nausea without vomiting.  Patient states that she noted left upper quadrant abdominal pain associated with fever of 101F about 3 weeks ago which self resolved after 1 to 2 days.  She now complains of abdominal pain which started gradually in the periumbilical area and has since worsened to generalized abdominal pain.  Pain was rated as 10/10 on pain scale and there was no known alleviating/aggravating factors.  She denies fever, chills, chest pain, shortness of breath, headache, diarrhea, constipation.  ED course: In the emergency department, she was hemodynamically stable, BP was 113/54 and other vital signs were within normal range.  Work-up in the ED showed normal CBC except for mild leukocytosis, normal BMP except for hyponatremia pregnancy test was negative, lipase 28, urinalysis was unimpressive for UTI. CT abdomen and pelvis with contrast showed appendix is dilated measuring up to 13 mm concerning for acute appendicitis. Patient was treated with IV ceftriaxone and Flagyl, pain medication with IV morphine and Toradol were given Zofran was given.  Hospitalist was asked to admit patient for further evaluation and management.  Review of Systems: As mentioned in the history of present illness. All other systems reviewed and are negative. Past Medical History:  Diagnosis Date   AMA (advanced maternal age) multigravida 35+    Cervical dysplasia    Diabetes  mellitus without complication (HCC)    Gestational   Genital herpes    Past Surgical History:  Procedure Laterality Date   CESAREAN SECTION  2010   breech presentation   CESAREAN SECTION N/A 03/05/2015   Procedure: CESAREAN SECTION;  Surgeon: [place] Bing, MD;  Location: [facility] ORS;  Service: Obstetrics;  Laterality: N/A;   LEEP     CIN II-III   Social History:  reports that she has never smoked. She has never used smokeless tobacco. She reports that she does not drink alcohol and does not use drugs.  Allergies  Allergen Reactions   Pineapple Itching    Throat itches    Family History  Problem Relation Age of Onset   Diabetes Mother    Hypertension Mother    Breast cancer Sister        deceased from CA at 37y/o    Prior to Admission medications   Medication Sig Start Date End Date Taking? Authorizing Provider  ibuprofen (ADVIL,MOTRIN) 600 MG tablet Take 1 tablet (600 mg total) by mouth every 6 (six) hours as needed for fever, headache, mild pain, moderate pain or cramping. 03/08/15   Conard Novak, MD  oxyCODONE-acetaminophen (PERCOCET/ROXICET) 5-325 MG tablet Take 1-2 tablets by mouth every 6 (six) hours as needed for moderate pain or severe pain. 03/08/15   Conard Novak, MD  Prenatal Vit-Fe Fumarate-FA (PRENATAL VITAMIN PO) Take 1 tablet by mouth daily.    [provider]  tamsulosin (FLOMAX) 0.4 MG CAPS capsule Take by mouth. 11/30/14   [provider]  valACYclovir (VALTREX) 1000 MG tablet Take 500 mg by mouth 2 (two) times daily.    [provider]  Physical Exam: Vitals:   07/19/21 0100 07/19/21 0130 07/19/21 0310 07/19/21 0400  BP: (!) 115/50 117/68 121/67 (!) 105/48  Pulse: 73 85 83 88  Resp: 17 18 16 16   Temp:      SpO2: 97% 96% 98% 95%  Weight:      Height:       General: Elderly patient was awake and alert and oriented x3.  She was ill appearing, but was not in any acute distress.  HEENT: NCAT.  PERRLA. EOMI. Sclerae  anicteric.  Dry mucosal membranes. Neck: Neck supple without lymphadenopathy. No carotid bruits. No masses palpated.  Cardiovascular: Regular rate with normal S1-S2 sounds. No murmurs, rubs or gallops auscultated. No JVD.  Respiratory: Clear breath sounds.  No accessory muscle use. Abdomen: Soft, tender to palpation of abdomen.  Positive Rovsing sign. Active bowel sounds.  Skin: No rashes, lesions, or ulcerations.  Dry, warm to touch. Musculoskeletal:  2+ dorsalis pedis and radial pulses. Good ROM.  No contractures  Psychiatric: Intact judgment and insight.  Mood appropriate to current condition. Neurologic: No focal neurological deficits. Strength is 5/5 x 4.  CN II - XII grossly intact.   Data Reviewed: EKG personally reviewed showed normal sinus rhythm at a rate of 79 bpm  Assessment and Plan: * Acute appendicitis- (present on admission) Continue IV NS at 75 mLs/Hr Continue IV Zosyn every 8 hours Continue IV Dilaudid 0.5 mg q.4h p.r.n. for moderate to severe pain Continue IV Zofran p.r.n. Continue n.p.o. in anticipation for possible surgical intervention in the morning Blood culture pending General surgery was already consulted and will follow up with patient in the morning   Abdominal pain- (present on admission) Continue IV Dilaudid as needed Continue management as described for acute appendicitis  Hyponatremia- (present on admission) Na 132, continue IV hydration  Leukocytosis This is possibly reactive vs infectious Continue treatment as described for acute appendicitis     Advance Care Planning: CODE STATUS: Full code  Consults: General surgery  Family Communication: Daughter at bedside (all questions answered to satisfaction)  Severity of Illness: The appropriate patient status for this patient is INPATIENT. Inpatient status is judged to be reasonable and necessary in order to provide the required intensity of service to ensure the patient's safety. The patient's  presenting symptoms, physical exam findings, and initial radiographic and laboratory data in the context of their chronic comorbidities is felt to place them at high risk for further clinical deterioration. Furthermore, it is not anticipated that the patient will be medically stable for discharge from the hospital within 2 midnights of admission.   * I certify that at the point of admission it is my clinical judgment that the patient will require inpatient hospital care spanning beyond 2 midnights from the point of admission due to high intensity of service, high risk for further deterioration and high frequency of surveillance required.*  Author: , DO 07/19/2021 4:52 AM  For on call review [email or web address].

## 2021-07-19 NOTE — Assessment & Plan Note (Signed)
Continue IV NS at 75 mLs/Hr Continue IV Zosyn every 8 hours Continue IV Dilaudid 0.5 mg q.4h p.r.n. for moderate to severe pain Continue IV Zofran p.r.n. Continue n.p.o. in anticipation for possible surgical intervention in the morning Blood culture pending General surgery was already consulted and will follow up with patient in the morning

## 2021-07-20 LAB — BASIC METABOLIC PANEL
Anion gap: 8 (ref 5–15)
BUN: 13 mg/dL (ref 6–20)
CO2: 20 mmol/L — ABNORMAL LOW (ref 22–32)
Calcium: 8.5 mg/dL — ABNORMAL LOW (ref 8.9–10.3)
Chloride: 109 mmol/L (ref 98–111)
Creatinine, Ser: 0.63 mg/dL (ref 0.44–1.00)
GFR, Estimated: >60 mL/min (ref >60)
Glucose, Bld: 110 mg/dL — ABNORMAL HIGH (ref 70–99)
Potassium: 3.7 mmol/L (ref 3.5–5.1)
Sodium: 137 mmol/L (ref 135–145)

## 2021-07-20 LAB — CBC WITH DIFFERENTIAL/PLATELET
Abs Immature Granulocytes: 0.05 10*3/uL (ref 0.00–0.07)
Basophils Absolute: 0 10*3/uL (ref 0.0–0.1)
Basophils Relative: 0 %
Eosinophils Absolute: 0 10*3/uL (ref 0.0–0.5)
Eosinophils Relative: 0 %
HCT: 33.9 % — ABNORMAL LOW (ref 36.0–46.0)
Hemoglobin: 11.2 g/dL — ABNORMAL LOW (ref 12.0–15.0)
Immature Granulocytes: 0 %
Lymphocytes Relative: 9 %
Lymphs Abs: 1.1 10*3/uL (ref 0.7–4.0)
MCH: 29.7 pg (ref 26.0–34.0)
MCHC: 33 g/dL (ref 30.0–36.0)
MCV: 89.9 fL (ref 80.0–100.0)
Monocytes Absolute: 0.3 10*3/uL (ref 0.1–1.0)
Monocytes Relative: 3 %
Neutro Abs: 10.9 10*3/uL — ABNORMAL HIGH (ref 1.7–7.7)
Neutrophils Relative %: 88 %
Platelets: 214 10*3/uL (ref 150–400)
RBC: 3.77 MIL/uL — ABNORMAL LOW (ref 3.87–5.11)
RDW: 13.2 % (ref 11.5–15.5)
WBC: 12.3 10*3/uL — ABNORMAL HIGH (ref 4.0–10.5)
nRBC: 0 % (ref 0.0–0.2)

## 2021-07-20 MED ORDER — ONDANSETRON HCL 4 MG PO TABS: 4.0000 mg | ORAL_TABLET | Freq: Three times a day (TID) | ORAL | 1 refills | Status: DC | PRN

## 2021-07-20 MED ORDER — AMOXICILLIN-POT CLAVULANATE 875-125 MG PO TABS: 1.0000 | ORAL_TABLET | Freq: Two times a day (BID) | ORAL | 0 refills | Status: AC

## 2021-07-20 MED ORDER — OXYCODONE HCL 5 MG PO TABS: 5.0000 mg | ORAL_TABLET | ORAL | 0 refills | Status: DC | PRN

## 2021-07-20 NOTE — Discharge Instructions (Signed)
Discharge Laparoscopic Surgery Instructions:  Common Complaints: Right shoulder pain is common after laparoscopic surgery. This is secondary to the gas used in the surgery being trapped under the diaphragm.  Walk to help your body absorb the gas. This will improve in a few days. Pain at the port sites are common, especially the larger port sites. This will improve with time.  Some nausea is common and poor appetite. The main goal is to stay hydrated the first few days after surgery.   Diet/ Activity: Diet as tolerated. You may not have an appetite, but it is important to stay hydrated. Drink 64 ounces of water a day. Your appetite will return with time.  Shower per your regular routine daily.  Do not take hot showers. Take warm showers that are less than 10 minutes. Rest and listen to your body, but do not remain in bed all day.  Walk everyday for at least 15-20 minutes. Deep cough and move around every 1-2 hours in the first few days after surgery.  Do not lift > 10 lbs, perform excessive bending, pushing, pulling, squatting for 1-2 weeks after surgery.  Remove the bandages on 07/21/2021. Do not pick at the staples. You can replace bandages if desired. Ok to shower and pat dry.  Do not place lotions or balms on your incision unless instructed to specifically by Dr. Henreitta Leber.   Pain Expectations and Narcotics: -After surgery you will have pain associated with your incisions and this is normal. The pain is muscular and nerve pain, and will get better with time. -You are encouraged and expected to take non narcotic medications like tylenol and ibuprofen (when able) to treat pain as multiple modalities can aid with pain treatment. -Narcotics are only used when pain is severe or there is breakthrough pain. -You are not expected to have a pain score of 0 after surgery, as we cannot prevent pain. A pain score of 3-4 that allows you to be functional, move, walk, and tolerate some activity is the goal.  The pain will continue to improve over the days after surgery and is dependent on your surgery. -Due to [place] law, we are only able to give a certain amount of pain medication to treat post operative pain, and we only give additional narcotics on a patient by patient basis.  -For most laparoscopic surgery, studies have shown that the majority of patients only need 10-15 narcotic pills, and for open surgeries most patients only need 15-20.   -Having appropriate expectations of pain and knowledge of pain management with non narcotics is important as we do not want anyone to become addicted to narcotic pain medication.  -Using ice packs in the first 48 hours and heating pads after 48 hours, wearing an abdominal binder (when recommended), and using over the counter medications are all ways to help with pain management.   -Simple acts like meditation and mindfulness practices after surgery can also help with pain control and research has proven the benefit of these practices.  Medication: Take tylenol and ibuprofen as needed for pain control, alternating every 4-6 hours.  Example:  Tylenol 1000mg  @ 6am, 12noon, 6pm, (Do not exceed 4000mg  of tylenol a day). Ibuprofen 800mg  @ 9am, 3pm, 9pm, 3am (Do not exceed 3600mg  of ibuprofen a day).  Take Roxicodone for breakthrough pain every 4 hours.  Take Colace for constipation related to narcotic pain medication. If you do not have a bowel movement in 2 days, take Miralax over the counter.  Drink  plenty of water to also prevent constipation.   Contact Information: If you have questions or concerns, please call our office, [phone], Monday- Thursday 8AM-5PM and Friday 8AM-12Noon.  If it is after hours or on the weekend, please call [facility], [phone], [phone], and ask to speak to the surgeon on call for Dr. Henreitta Leber at [facility].    Instrucciones de [place] Laparoscpica de [place]:  Quejas comunes: El dolor en el hombro derecho es  comn despus de la ciruga laparoscpica. Esto es secundario a que el gas utilizado en la ciruga queda atrapado debajo del diafragma. Camine para ayudar a su cuerpo a absorber el gas. Esto mejorar en [employer]. El dolor en los sitios de los puertos es comn, Haematologist en los sitios de los puertos ms grandes. Esto mejorar con [employer]. Algunas nuseas son comunes y falta de apetito. El objetivo principal es mantenerse hidratado los primeros das despus de la [place].  Dieta/ Actividad: Dieta segn tolerancia. Es posible que no tenga apetito, pero es importante mantenerse hidratado. Beba 64 onzas de agua al [employer]. Su apetito volver con Museum/gallery conservator. Dchese segn su rutina habitual todos los [place]. No tome duchas calientes. Tome duchas calientes que duren menos de 10 minutos. Descansa y escucha a tu cuerpo, pero no te quedes en la cama todo Medical laboratory scientific officer. Camine todos los [address] al menos 15-20 minutos. Tos profunda y moverse cada 1-2 horas en los primeros das despus de la [place]. No levante ms de 10 libras, realice flexiones excesivas, empuje, jale o se ponga en cuclillas durante 1 a 2 semanas despus de la ciruga. Retire los vendajes el 27/06/2021. No toques las grapas. Puede reemplazar los vendajes si lo desea. Est bien para ducharse y secarse.  No aplique lociones ni blsamos en la incisin a menos que el Dr. Henreitta Leber se lo indique especficamente.  Expectativas de Engineer, mining y narcticos: -Despus de la ciruga tendr dolor asociado con sus incisiones y esto es normal. El dolor es muscular y [place], y mejorar con Museum/gallery conservator. -Se recomienda y se espera que tome medicamentos no narcticos como Tylenol e ibuprofeno (cuando pueda) para tratar Chief Technology Officer, ya que mltiples modalidades pueden ayudar con el tratamiento del Engineer, mining. -Los narcticos solo se usan cuando el dolor es intenso o hay un dolor irruptivo. -No se espera que tenga una puntuacin de dolor de 0 despus de la ciruga, ya que no  podemos prevenir Chief Technology Officer. El objetivo es una puntuacin de Engineer, mining de 3-4 que le permita ser funcional, [place], caminar y Therapist, art. El Engineer, mining continuar mejorando [employer] posteriores a la [place] y depende de su [place]. -Debido a la ley de [place], solo podemos dar una cierta cantidad de analgsicos para tratar Chief Technology Officer posoperatorio, y solo damos narcticos adicionales paciente por paciente. -Para la mayora de las cirugas laparoscpicas, los estudios han demostrado que la mayora de los pacientes solo necesitan de 10 a 15 pastillas narcticas, y para las [address], la mayora de los pacientes solo necesitan de 15 a 20. -Es importante tener expectativas apropiadas sobre el dolor y conocimientos sobre el manejo del dolor sin narcticos, ya que no queremos que nadie se vuelva adicto a los Biochemist, clinical. -Usar bolsas de hielo en las primeras 48 horas y almohadillas trmicas despus de 48 horas, usar una faja abdominal (cuando se recomienda) y usar medicamentos de venta libre son formas de ayudar a Human resources officer. -Actos simples como la meditacin y las prcticas  de atencin plena despus de la ciruga tambin pueden ayudar a Human resources officer y la investigacin ha demostrado el beneficio de Scientist, physiological.  Medicamento: Tome Tylenol e ibuprofeno segn sea necesario para controlar el dolor, alternando cada 4 a 6 horas. Ejemplo: Tylenol 1000 mg a las 6 am, 12 del medioda, 6 pm, 12 de la noche (no exceda los 4000 mg de tylenol por da). Ibuprofeno 800 mg a las 9 a. m., 3 p. m., 9 p. m., 3 a. m. (No exceda los 3600 mg de ibuprofeno por da). Tome Roxicodona para el dolor irruptivo cada 4 horas. Tome Colace para el estreimiento relacionado con analgsicos narcticos. Si no defeca en 2 das, tome Miralax sin receta. Birdie Hopes agua para prevenir tambin el estreimiento.  Informacin del contacto: Si tiene preguntas o inquietudes, llame a Ferne Coe  oficina al [phone], de lunes a jueves de 8 a. m. a 5 p. m. y viernes de 8 a. m. a 12 p. m. Si es fuera del horario de atencin o durante el fin de [place], llame al [place], [phone], [phone], y pida hablar con el cirujano de guardia del Dr. Henreitta Leber en [facility].

## 2021-07-20 NOTE — TOC Progression Note (Signed)
Transition of Care [facility]) - Progression Note    Patient Details  Name: Jean Aguirre MRN: [id number] Date of Birth: February 02, 1976  Transition of Care [facility]) CM/SW Contact  Karn Cassis, [place] Phone Number: 07/20/2021, 10:57 AM  Clinical Narrative:   Transition of Care (TOC) Screening Note   Patient Details  Name: Jean Aguirre Date of Birth: [birth year]   Transition of Care [facility]) CM/SW Contact:    Karn Cassis, LCSW Phone Number: 07/20/2021, 10:57 AM    Transition of Care Department [facility]) has reviewed patient and no TOC needs have been identified at this time. We will continue to monitor patient advancement through interdisciplinary progression rounds. If new patient transition needs arise, please place a TOC consult.            Expected Discharge Plan and Services                                                 Social Determinants of Health (SDOH) Interventions    Readmission Risk Interventions No flowsheet data found.

## 2021-07-20 NOTE — Discharge Summary (Signed)
Physician Discharge Summary  Patient ID: Jean Aguirre MRN: [id number] DOB/AGE: January 21, 1976 46 y.o.  Admit date: 07/18/2021 Discharge date: 07/20/2021  Admission Diagnoses: Acute appendicitis   Discharge Diagnoses:  Principal Problem:   Acute perforated appendicitis Active Problems:   Abdominal pain   Leukocytosis   Hyponatremia   Discharged Condition: good  Hospital Course: Jean Aguirre is a 46 yo with acute appendicitis on admission that was taken back to surgery and found to have perforated appendicitis. She was kept overnight for more antibiotics and to ensure she had no issues with eating and pain control. She was advanced to a soft diet and did well. She was having acceptable pain and was ambulating.   Consults:  Hospitalist admission- transferred to surgery preop   Significant Diagnostic Studies: CT acute appendicitis   Treatments: IV hydration, antibiotics: Zosyn, and surgery: laparoscopic appendectomy 07/19/21  Discharge Exam: Blood pressure (!) 119/58, pulse 84, temperature 98.4 F (36.9 C), resp. rate 17, height 5' (1.524 m), weight 64.9 kg, last menstrual period 06/28/2021, SpO2 98 %, unknown if currently breastfeeding. General appearance: alert and no distress Resp: normal work of breathing GI: soft, minimally distended, appropriately tender, port site with gauze and tegaderm  Disposition: Discharge disposition: 01-Home or Self Care       Discharge Instructions     Call MD for:  difficulty breathing, headache or visual disturbances   Complete by: As directed    Call MD for:  extreme fatigue   Complete by: As directed    Call MD for:  persistant dizziness or light-headedness   Complete by: As directed    Call MD for:  persistant nausea and vomiting   Complete by: As directed    Call MD for:  redness, tenderness, or signs of infection (pain, swelling, redness, odor or green/yellow discharge around incision site)   Complete by: As directed     Call MD for:  severe uncontrolled pain   Complete by: As directed    Call MD for:  temperature >100.4   Complete by: As directed    Increase activity slowly   Complete by: As directed       Allergies as of 07/20/2021       Reactions   Pineapple Itching   Throat itches        Medication List     STOP taking these medications    oxyCODONE-acetaminophen 5-325 MG tablet Commonly known as: PERCOCET/ROXICET       TAKE these medications    amoxicillin-clavulanate 875-125 MG tablet Commonly known as: Augmentin Take 1 tablet by mouth 2 (two) times daily for 5 days.   ibuprofen 600 MG tablet Commonly known as: ADVIL Take 1 tablet (600 mg total) by mouth every 6 (six) hours as needed for fever, headache, mild pain, moderate pain or cramping.   ibuprofen 800 MG tablet Commonly known as: ADVIL Take 800 mg by mouth every 8 (eight) hours.   NuvaRing 0.12-0.015 MG/24HR vaginal ring Generic drug: etonogestrel-ethinyl estradiol Place vaginally.   omeprazole 40 MG capsule Commonly known as: PRILOSEC Take 40 mg by mouth daily.   ondansetron 4 MG tablet Commonly known as: Zofran Take 1 tablet (4 mg total) by mouth every 8 (eight) hours as needed.   oxyCODONE 5 MG immediate release tablet Commonly known as: Oxy IR/ROXICODONE Take 1 tablet (5 mg total) by mouth every 4 (four) hours as needed for severe pain or breakthrough pain.   PRENATAL VITAMIN PO Take 1 tablet by mouth daily.  PreviDent 5000 Enamel Protect 1.1-5 % Gel Generic drug: Sod Fluoride-Potassium Nitrate Take by mouth.   tamsulosin 0.4 MG Caps capsule Commonly known as: FLOMAX Take by mouth.   valACYclovir 1000 MG tablet Commonly known as: VALTREX Take 500 mg by mouth 2 (two) times daily.   Valtrex 500 MG tablet Generic drug: valACYclovir Take 500 mg by mouth daily.        Follow-up Information     Virl Cagey, MD Follow up on 07/31/2021.   Specialty: General Surgery Why: staple  removal Contact information: [address] Linna Hoff [facility] [address] [phone]                 Signed: Virl Cagey 07/20/2021, 1:47 PM

## 2021-07-22 LAB — SURGICAL PATHOLOGY

## 2021-07-23 ENCOUNTER — Emergency Department (HOSPITAL_COMMUNITY): Payer: Self-pay

## 2021-07-23 ENCOUNTER — Other Ambulatory Visit: Payer: Self-pay

## 2021-07-23 ENCOUNTER — Emergency Department (HOSPITAL_COMMUNITY)
Admission: EM | Admit: 2021-07-23 | Discharge: 2021-07-23 | Disposition: A | Discharge status: Home / Self Care | Payer: Self-pay | Attending: Emergency Medicine | Admitting: Emergency Medicine

## 2021-07-23 DIAGNOSIS — R0602 Shortness of breath: Secondary | ICD-10-CM | POA: Insufficient documentation

## 2021-07-23 DIAGNOSIS — R0781 Pleurodynia: Secondary | ICD-10-CM | POA: Insufficient documentation

## 2021-07-23 LAB — BASIC METABOLIC PANEL
Anion gap: 9 (ref 5–15)
BUN: 13 mg/dL (ref 6–20)
CO2: 25 mmol/L (ref 22–32)
Calcium: 8.9 mg/dL (ref 8.9–10.3)
Chloride: 103 mmol/L (ref 98–111)
Creatinine, Ser: 0.7 mg/dL (ref 0.44–1.00)
GFR, Estimated: >60 mL/min (ref >60)
Glucose, Bld: 100 mg/dL — ABNORMAL HIGH (ref 70–99)
Potassium: 4.2 mmol/L (ref 3.5–5.1)
Sodium: 137 mmol/L (ref 135–145)

## 2021-07-23 LAB — CBC WITH DIFFERENTIAL/PLATELET
Abs Immature Granulocytes: 0.02 10*3/uL (ref 0.00–0.07)
Basophils Absolute: 0 10*3/uL (ref 0.0–0.1)
Basophils Relative: 1 %
Eosinophils Absolute: 0.2 10*3/uL (ref 0.0–0.5)
Eosinophils Relative: 3 %
HCT: 39.4 % (ref 36.0–46.0)
Hemoglobin: 12.7 g/dL (ref 12.0–15.0)
Immature Granulocytes: 0 %
Lymphocytes Relative: 44 %
Lymphs Abs: 3.2 10*3/uL (ref 0.7–4.0)
MCH: 28.6 pg (ref 26.0–34.0)
MCHC: 32.2 g/dL (ref 30.0–36.0)
MCV: 88.7 fL (ref 80.0–100.0)
Monocytes Absolute: 0.5 10*3/uL (ref 0.1–1.0)
Monocytes Relative: 7 %
Neutro Abs: 3.2 10*3/uL (ref 1.7–7.7)
Neutrophils Relative %: 45 %
Platelets: 290 10*3/uL (ref 150–400)
RBC: 4.44 MIL/uL (ref 3.87–5.11)
RDW: 12.9 % (ref 11.5–15.5)
WBC: 7.1 10*3/uL (ref 4.0–10.5)
nRBC: 0 % (ref 0.0–0.2)

## 2021-07-23 LAB — PREGNANCY, URINE: Preg Test, Ur: NEGATIVE

## 2021-07-23 MED ORDER — IOHEXOL 350 MG/ML SOLN
100.0000 mL | Freq: Once | INTRAVENOUS | Status: AC | PRN
  Administered 2021-07-23: 100 mL via INTRAVENOUS

## 2021-07-23 NOTE — ED Triage Notes (Signed)
Shortness of breath x 4 days after having surgery on Saturday. Denies fever. Reports palpitations brought on by shortness of breath intermittently throughout the day.  ?

## 2021-07-23 NOTE — ED Provider Notes (Signed)
[facility] EMERGENCY DEPARTMENT Provider Note   CSN: [id number] Arrival date & time: 07/23/21  1936     History  Chief Complaint  Patient presents with   Shortness of Breath    Jean Aguirre is a 46 y.o. female.  HPI  HPI was collected while using Spanish interpreter  Patient with medical history including recent appendectomy presents with complaints of shortness of breath.  Patient states that this started since she was discharged from the hospital on Sunday, she states that the shortness of breath is mainly while she is talking for a long time, she feels as if she cannot catch her breath, she will occasionally have pleuritic chest pain and this is only after she speaks for a long time, she does not get short of breath with exertion, she has no actual chest pain, no history of PEs or DVTs currently not on hormone therapy, she has no cardiac history.  She denies any stomach pains nausea vomiting diarrhea, states she still passing gas and having bowel movements, no hematochezia or melena, patient has no complaints at this time.  She currently has no shortness of breath.  Reviewed patient's medical record was seen by Dr. Henreitta Leber underwent appendectomy she had a perforated appendicitis was seen on Saturday and was discharged home on Sunday.  Home Medications Prior to Admission medications   Medication Sig Start Date End Date Taking? Authorizing Provider  acetaminophen (TYLENOL) 500 MG tablet Take 1,000 mg by mouth every 6 (six) hours as needed.   Yes [provider]  amoxicillin-clavulanate (AUGMENTIN) 875-125 MG tablet Take 1 tablet by mouth 2 (two) times daily for 5 days. 07/20/21 07/25/21 Yes Lucretia Roers, MD  ondansetron (ZOFRAN) 4 MG tablet Take 1 tablet (4 mg total) by mouth every 8 (eight) hours as needed. 07/20/21 07/20/22 Yes Lucretia Roers, MD  oxyCODONE (OXY IR/ROXICODONE) 5 MG immediate release tablet Take 1 tablet (5 mg total) by mouth every 4  (four) hours as needed for severe pain or breakthrough pain. 07/20/21  Yes Lucretia Roers, MD  PREVIDENT 5000 ENAMEL PROTECT 1.1-5 % GEL Take by mouth. 03/13/21  Yes [provider]  ibuprofen (ADVIL,MOTRIN) 600 MG tablet Take 1 tablet (600 mg total) by mouth every 6 (six) hours as needed for fever, headache, mild pain, moderate pain or cramping. 03/08/15   Conard Novak, MD  NUVARING 0.12-0.015 MG/24HR vaginal ring Place vaginally. Patient not taking: Reported on 07/23/2021 05/22/21   [provider]      Allergies    Pineapple    Review of Systems   Review of Systems  Constitutional:  Negative for chills and fever.  Respiratory:  Positive for shortness of breath.   Cardiovascular:  Negative for chest pain.  Gastrointestinal:  Negative for abdominal pain, diarrhea, nausea, rectal pain and vomiting.  Neurological:  Negative for headaches.   Physical Exam Updated Vital Signs BP 126/72    Pulse 76    Temp 98.5 F (36.9 C) (Oral)    Resp 18    LMP 06/28/2021 Comment: hcg negative 07/18/2021   SpO2 99%  Physical Exam Vitals and nursing note reviewed.  Constitutional:      General: She is not in acute distress.    Appearance: She is not ill-appearing.  HENT:     Head: Normocephalic and atraumatic.     Nose: No congestion.  Eyes:     Conjunctiva/sclera: Conjunctivae normal.  Cardiovascular:     Rate and Rhythm: Normal rate and regular  rhythm.     Pulses: Normal pulses.     Heart sounds: No murmur heard.   No friction rub. No gallop.  Pulmonary:     Effort: No respiratory distress.     Breath sounds: No wheezing, rhonchi or rales.  Abdominal:     Palpations: Abdomen is soft.     Tenderness: There is no abdominal tenderness. There is no right CVA tenderness or left CVA tenderness.     Comments: Patient has noted staples above her umbilicus, healing well, no signs infection present.  Abdomen is nondistended normal bowel sounds, dull to percussion, nontender to  palpation no guarding rebound or peritoneal sign.  Musculoskeletal:     Right lower leg: No edema.     Left lower leg: No edema.  Skin:    General: Skin is warm and dry.  Neurological:     Mental Status: She is alert.  Psychiatric:        Mood and Affect: Mood normal.    ED Results / Procedures / Treatments   Labs (all labs ordered are listed, but only abnormal results are displayed) Labs Reviewed  BASIC METABOLIC PANEL - Abnormal; Notable for the following components:      Result Value   Glucose, Bld 100 (*)    All other components within normal limits  CBC WITH DIFFERENTIAL/PLATELET  PREGNANCY, URINE    EKG EKG Interpretation  Date/Time:  Wednesday July 23 2021 20:16:56 EST Ventricular Rate:  68 PR Interval:  142 QRS Duration: 87 QT Interval:  386 QTC Calculation: 411 R Axis:   24 Text Interpretation: Sinus rhythm Ventricular premature complex Abnormal R-wave progression, early transition No significant change since last tracing Confirmed by Meridee Score [phone]) on 07/23/2021 8:39:11 PM  Radiology DG Chest 2 View  Result Date: 07/23/2021 CLINICAL DATA:  Shortness of breath EXAM: CHEST - 2 VIEW COMPARISON:  None. FINDINGS: The heart and mediastinal contours are within normal limits. No focal consolidation. No pulmonary edema. No pleural effusion. No pneumothorax. No acute osseous abnormality. IMPRESSION: No active cardiopulmonary disease. Electronically Signed   By: Tish Frederickson M.D.   On: 07/23/2021 21:27   CT Angio Chest PE W and/or Wo Contrast  Result Date: 07/23/2021 CLINICAL DATA:  Concern for pulmonary embolism. EXAM: CT ANGIOGRAPHY CHEST WITH CONTRAST TECHNIQUE: Multidetector CT imaging of the chest was performed using the standard protocol during bolus administration of intravenous contrast. Multiplanar CT image reconstructions and MIPs were obtained to evaluate the vascular anatomy. RADIATION DOSE REDUCTION: This exam was performed according to the departmental  dose-optimization program which includes automated exposure control, adjustment of the mA and/or kV according to patient size and/or use of iterative reconstruction technique. CONTRAST:  OMNIPAQUE IOHEXOL 350 MG/ML SOLN COMPARISON:  Chest radiograph dated 07/23/2021. FINDINGS: Cardiovascular: Top-normal cardiac size. No pericardial effusion. The thoracic aorta is unremarkable. The origins of the great vessels of the aortic arch appear patent. Evaluation of the pulmonary arteries is limited due to respiratory motion artifact. No pulmonary artery embolus identified. Mediastinum/Nodes: No hilar or mediastinal adenopathy. The esophagus and thyroid gland are grossly unremarkable. No mediastinal fluid collection. Lungs/Pleura: The lungs are clear. There is no pleural effusion pneumothorax. The central airways are patent. Upper Abdomen: No acute abnormality. Musculoskeletal: No chest wall abnormality. No acute or significant osseous findings. Review of the MIP images confirms the above findings. IMPRESSION: No acute intrathoracic pathology. No CT evidence of pulmonary artery embolus. Electronically Signed   By: Elgie Collard M.D.   On:  07/23/2021 22:24    Procedures Procedures    Medications Ordered in ED Medications  iohexol (OMNIPAQUE) 350 MG/ML injection 100 mL (100 mLs Intravenous Contrast Given 07/23/21 2205)    ED Course/ Medical Decision Making/ A&P                           Medical Decision Making Amount and/or Complexity of Data Reviewed Labs: ordered. Radiology: ordered.  Risk Prescription drug management.   This patient presents to the ED for concern of shortness of breath, this involves an extensive number of treatment options, and is a complaint that carries with it a high risk of complications and morbidity.  The differential diagnosis includes PE, pneumonia, ACS, intra-abdominal infection    Additional history obtained:  Additional history obtained from electronic medical  record External records from outside source obtained and reviewed including please see HPI   Co morbidities that complicate the patient evaluation  Status post appendicectomy  Social Determinants of Health:  Non-English speaker    Lab Tests:  I Ordered, and personally interpreted labs.  The pertinent results include: CBC unremarkable, BMP shows hyperglycemia 100, urine pregnancy negative   Imaging Studies ordered:  I ordered imaging studies including chest x-ray, CT of chest I independently visualized and interpreted imaging which showed both of which negative for acute findings I agree with the radiologist interpretation   Cardiac Monitoring:  The patient was maintained on a cardiac monitor.  I personally viewed and interpreted the cardiac monitored which showed an underlying rhythm of: EKG without signs of ischemia   Medicines ordered and prescription drug management:  I ordered medication including N/A I have reviewed the patients home medicines and have made adjustments as needed  Reevaluation:  Presents with shortness of breath, due to her recent surgery I am concerned for possible PE, will obtain basic lab work obtain CTA of chest and reassess.  Patient was found resting calmly, has no complaints, updated lab and imaging she is agreeable to discharge.  Rule out I have low suspicion for ACS as history is atypical, patient has no cardiac history, EKG was sinus rhythm without signs of ischemia, troponins are deferred at this time as she is not having any chest pain.  low suspicion for PE or dissection as CT imaging negative for these findings.  Low suspicion for systemic infection as patient is nontoxic-appearing, vital signs reassuring, no obvious source infection noted on exam.  Low suspicion for intra-abdominal infection or bowel obstruction she is afebrile nontachycardic no leukocytosis, abdomen nontender my exam so passing gas and having normal bowel  movements.     Dispostion and problem list  After consideration of the diagnostic results and the patients response to treatment, I feel that the patent would benefit from discharge.  Shortness of breath-unclear etiology, likely atelectasis from anesthesia, will recommend that she follows up with general surgery and/or PCP for further evaluation.  Gave strict return precautions.            Final Clinical Impression(s) / ED Diagnoses Final diagnoses:  Shortness of breath    Rx / DC Orders ED Discharge Orders     None         Carroll Sage, PA-C 07/23/21 2252    Terrilee Files, MD 07/24/21 [phone]

## 2021-07-23 NOTE — Discharge Instructions (Addendum)
Lab work and imaging are reassuring, possibly shortness of breath is from your lungs not being fully inflated after surgery should resolve on its own.  Recommend follow-up with your PCP or general surgery. ? ?I want you to come back  if you have worsening shortness of breath chest pain, severe stomach pain nausea vomiting, unable to have any bowel movements as you will need further evaluation. ?

## 2021-07-24 ENCOUNTER — Encounter (HOSPITAL_COMMUNITY): Payer: Self-pay | Admitting: General Surgery

## 2021-07-24 LAB — CULTURE, BLOOD (ROUTINE X 2)
Culture: NO GROWTH
Culture: NO GROWTH
Special Requests: ADEQUATE

## 2021-07-31 ENCOUNTER — Encounter: Payer: Self-pay | Admitting: General Surgery

## 2021-07-31 ENCOUNTER — Other Ambulatory Visit: Payer: Self-pay

## 2021-07-31 ENCOUNTER — Ambulatory Visit (INDEPENDENT_AMBULATORY_CARE_PROVIDER_SITE_OTHER): Payer: Self-pay | Admitting: General Surgery

## 2021-07-31 VITALS — BP 101/48 | HR 72 | Temp 98.6°F | Resp 18 | Ht 60.0 in

## 2021-07-31 DIAGNOSIS — K3532 Acute appendicitis with perforation and localized peritonitis, without abscess: Secondary | ICD-10-CM

## 2021-07-31 NOTE — Patient Instructions (Signed)
Diet and activity as tolerated. ?Ok to eat whatever you like. ?Ok to drive if not on narcotic pain medication. ?Ok to return to work.  ? ?Dieta y [place] seg?n tolerancia. ?Est? bien para comer lo que quieras. ?Est? bien conducir si no est? tomando analg?sicos narc?ticos. ?Est? bien para volver al Aleen Campi. ?

## 2021-07-31 NOTE — Progress Notes (Signed)
[facility] ? ?Eating, drinking and having Bms. Pain resolved. Feels bloated and feels her intestines moving at times.  ? ?BP (!) 101/48   Pulse 72   Temp 98.6 ?F (37 ?C) (Oral)   Resp 18   Ht 5' (1.524 m)   LMP  (LMP Unknown)   SpO2 98%   BMI 27.94 kg/m?  ?Incisions c/d/I with staples, removed, steri strips ? ?Patient s.p laparoscopic appendectomy for perforated appendicitis. Doing well.  ? ?Diet and activity as tolerated. ?Ok to eat whatever you like. ?Ok to drive if not on narcotic pain medication. ?Ok to return to work.  ? ?FINAL MICROSCOPIC DIAGNOSIS:  ? ?A. APPENDIX, APPENDECTOMY:  ?- Acute appendicitis with perforation and acute serositis ? ?Algis Greenhouse, MD ?[facility] ?[address] ?[place], [place] [address] ?[phone] (office) ?Marland Kitchen  ?

## 2022-01-12 ENCOUNTER — Other Ambulatory Visit (HOSPITAL_COMMUNITY): Payer: Self-pay | Admitting: Obstetrics and Gynecology

## 2022-01-12 DIAGNOSIS — Z1231 Encounter for screening mammogram for malignant neoplasm of breast: Secondary | ICD-10-CM

## 2022-02-06 ENCOUNTER — Other Ambulatory Visit: Payer: Self-pay

## 2022-02-06 ENCOUNTER — Ambulatory Visit (HOSPITAL_COMMUNITY)
Admission: RE | Admit: 2022-02-06 | Discharge: 2022-02-06 | Disposition: A | Discharge status: Home / Self Care | Payer: Self-pay | Source: Ambulatory Visit | Attending: Obstetrics and Gynecology | Admitting: Obstetrics and Gynecology

## 2022-02-06 ENCOUNTER — Inpatient Hospital Stay: Payer: Self-pay | Attending: Obstetrics and Gynecology | Admitting: Hematology and Oncology

## 2022-02-06 VITALS — BP 151/78 | Wt 145.2 lb

## 2022-02-06 DIAGNOSIS — Z1231 Encounter for screening mammogram for malignant neoplasm of breast: Secondary | ICD-10-CM

## 2022-02-06 DIAGNOSIS — Z1211 Encounter for screening for malignant neoplasm of colon: Secondary | ICD-10-CM

## 2022-02-06 NOTE — Progress Notes (Signed)
[name] is a 46 y.o. female who presents to [facility] clinic today with no complaints .    Pap Smear: Pap not smear completed today. Last Pap smear was 07/06/2019 at [facility]) clinic and was normal. Per patient has history of an abnormal Pap smear. Last Pap smear result is available in Epic. Abnormals: 2010 [facility] 03/12/2010- [facility])- both with colpo follow up, 3 normals since then.   Physical exam: Breasts Breasts symmetrical. No skin abnormalities bilateral breasts. No nipple retraction bilateral breasts. No nipple discharge bilateral breasts. No lymphadenopathy. No lumps palpated bilateral breasts.     MS DIGITAL SCREENING TOMO BILATERAL  Result Date: 01/25/2021 CLINICAL DATA:  Screening. EXAM: DIGITAL SCREENING BILATERAL MAMMOGRAM WITH TOMOSYNTHESIS AND CAD TECHNIQUE: Bilateral screening digital craniocaudal and mediolateral oblique mammograms were obtained. Bilateral screening digital breast tomosynthesis was performed. The images were evaluated with computer-aided detection. COMPARISON:  Previous exam(s). ACR Breast Density Category c: The breast tissue is heterogeneously dense, which may obscure small masses. FINDINGS: There are no findings suspicious for malignancy. IMPRESSION: No mammographic evidence of malignancy. A result letter of this screening mammogram will be mailed directly to the patient. RECOMMENDATION: Screening mammogram in one year. (Code:SM-B-01Y) BI-RADS CATEGORY  1: Negative. Electronically Signed   By: Sherian Rein M.D.   On: 01/25/2021 20:32  MS DIGITAL SCREENING TOMO BILATERAL  Result Date: 08/09/2019 CLINICAL DATA:  Screening. EXAM: DIGITAL SCREENING BILATERAL MAMMOGRAM WITH TOMO AND CAD COMPARISON:  Previous exam(s). ACR Breast Density Category c: The breast tissue is heterogeneously dense, which may obscure small masses. FINDINGS: There are no findings suspicious for malignancy. Images were processed with CAD.  IMPRESSION: No mammographic evidence of malignancy. A result letter of this screening mammogram will be mailed directly to the patient. RECOMMENDATION: Screening mammogram in one year. (Code:SM-B-01Y) BI-RADS CATEGORY  1: Negative. Electronically Signed   By: Elberta Fortis M.D.   On: 08/09/2019 13:09      Pelvic/Bimanual Pap is not indicated today    Smoking History: Patient has never smoked and was not referred to quit line.    Patient Navigation: Patient education provided. Access to services provided for patient through BCCCP program. Samul Dada interpreter provided. No transportation provided   Colorectal Cancer Screening: Per patient has never had colonoscopy completed No complaints today.    Breast and Cervical Cancer Risk Assessment: Patient does not have family history of breast cancer, known genetic mutations, or radiation treatment to the chest before age 44. Patient has history of cervical dysplasia, immunocompromised, or DES exposure in-utero.  Risk Assessment   No risk assessment data for the current encounter  Risk Scores       01/21/2021   Last edited by: Scarlett Presto, RN   5-year risk: 1.2 %   Lifetime risk: 13.1 %            A: BCCCP exam without pap smear No complaints, benign exam.  P: [facility]. Appointment scheduled 02/06/2022.  Jean Aguirre A, NP 02/06/2022 11:59 AM

## 2022-02-28 LAB — FECAL OCCULT BLOOD, IMMUNOCHEMICAL

## 2022-03-04 ENCOUNTER — Telehealth: Payer: Self-pay

## 2022-03-04 NOTE — Telephone Encounter (Signed)
Via Lavon Paganini, Spanish Interpreter [facility]), Patient informed FIT test was not performed due to delay in transport to lab, can send new kit to lab if prefers. Patient requested to new kit. New FIT test kit mailed to patient.

## 2022-03-19 ENCOUNTER — Other Ambulatory Visit: Payer: Self-pay

## 2022-03-19 DIAGNOSIS — Z1211 Encounter for screening for malignant neoplasm of colon: Secondary | ICD-10-CM

## 2022-08-14 LAB — FECAL OCCULT BLOOD, IMMUNOCHEMICAL: Fecal Occult Bld: NEGATIVE

## 2023-03-08 ENCOUNTER — Other Ambulatory Visit: Payer: Self-pay | Admitting: Obstetrics and Gynecology

## 2023-03-08 DIAGNOSIS — Z1231 Encounter for screening mammogram for malignant neoplasm of breast: Secondary | ICD-10-CM

## 2023-05-07 ENCOUNTER — Ambulatory Visit (HOSPITAL_COMMUNITY): Payer: Self-pay

## 2023-05-07 ENCOUNTER — Inpatient Hospital Stay: Payer: Self-pay

## 2023-05-07 ENCOUNTER — Ambulatory Visit: Payer: Self-pay

## 2023-06-04 ENCOUNTER — Inpatient Hospital Stay: Payer: Self-pay | Attending: Obstetrics and Gynecology | Admitting: Hematology and Oncology

## 2023-06-04 ENCOUNTER — Ambulatory Visit (HOSPITAL_COMMUNITY)
Admission: RE | Admit: 2023-06-04 | Discharge: 2023-06-04 | Disposition: A | Discharge status: Home / Self Care | Payer: Self-pay | Source: Ambulatory Visit | Attending: Obstetrics and Gynecology | Admitting: Obstetrics and Gynecology

## 2023-06-04 VITALS — BP 125/58 | Wt 146.8 lb

## 2023-06-04 DIAGNOSIS — Z1231 Encounter for screening mammogram for malignant neoplasm of breast: Secondary | ICD-10-CM | POA: Insufficient documentation

## 2023-06-04 NOTE — Progress Notes (Signed)
 [name] is a 48 y.o. female who presents to [facility] clinic today with no complaints.    Pap Smear: Pap not smear completed today. Last Pap smear was 07/06/2019 at [facility] clinic and was normal. Per patient has no history of an abnormal Pap smear. Last Pap smear result is available in Epic.   Physical exam: Breasts Breasts symmetrical. No skin abnormalities bilateral breasts. No nipple retraction bilateral breasts. No nipple discharge bilateral breasts. No lymphadenopathy. No lumps palpated bilateral breasts.   MS DIGITAL SCREENING TOMO BILATERAL Result Date: 02/09/2022 CLINICAL DATA:  Screening. EXAM: DIGITAL SCREENING BILATERAL MAMMOGRAM WITH TOMOSYNTHESIS AND CAD TECHNIQUE: Bilateral screening digital craniocaudal and mediolateral oblique mammograms were obtained. Bilateral screening digital breast tomosynthesis was performed. The images were evaluated with computer-aided detection. COMPARISON:  Previous exam(s). ACR Breast Density Category c: The breast tissue is heterogeneously dense, which may obscure small masses. FINDINGS: There are no findings suspicious for malignancy. IMPRESSION: No mammographic evidence of malignancy. A result letter of this screening mammogram will be mailed directly to the patient. RECOMMENDATION: Screening mammogram in one year. (Code:SM-B-01Y) BI-RADS CATEGORY  1: Negative. Electronically Signed   By: Jean Aguirre M.D.   On: 02/09/2022 14:30   MS DIGITAL SCREENING TOMO BILATERAL Result Date: 01/25/2021 CLINICAL DATA:  Screening. EXAM: DIGITAL SCREENING BILATERAL MAMMOGRAM WITH TOMOSYNTHESIS AND CAD TECHNIQUE: Bilateral screening digital craniocaudal and mediolateral oblique mammograms were obtained. Bilateral screening digital breast tomosynthesis was performed. The images were evaluated with computer-aided detection. COMPARISON:  Previous exam(s). ACR Breast Density Category c: The breast tissue is heterogeneously dense, which may obscure small  masses. FINDINGS: There are no findings suspicious for malignancy. IMPRESSION: No mammographic evidence of malignancy. A result letter of this screening mammogram will be mailed directly to the patient. RECOMMENDATION: Screening mammogram in one year. (Code:SM-B-01Y) BI-RADS CATEGORY  1: Negative. Electronically Signed   By: Jean Aguirre M.D.   On: 01/25/2021 20:32  MS DIGITAL SCREENING TOMO BILATERAL Result Date: 08/09/2019 CLINICAL DATA:  Screening. EXAM: DIGITAL SCREENING BILATERAL MAMMOGRAM WITH TOMO AND CAD COMPARISON:  Previous exam(s). ACR Breast Density Category c: The breast tissue is heterogeneously dense, which may obscure small masses. FINDINGS: There are no findings suspicious for malignancy. Images were processed with CAD. IMPRESSION: No mammographic evidence of malignancy. A result letter of this screening mammogram will be mailed directly to the patient. RECOMMENDATION: Screening mammogram in one year. (Code:SM-B-01Y) BI-RADS CATEGORY  1: Negative. Electronically Signed   By: Jean Aguirre M.D.   On: 08/09/2019 13:09        Pelvic/Bimanual Pap is not indicated today    Smoking History: Patient has never smoked and was not referred to quit line.    Patient Navigation: Patient education Aguirre. Access to services Aguirre for patient through [facility] program. Jean Aguirre. No transportation Aguirre   Colorectal Cancer Screening: Per patient has never had colonoscopy completed No complaints today. FIT test completed May 2024 - negative   Breast and Cervical Cancer Risk Assessment: Patient does not have family history of breast cancer, known genetic mutations, or radiation treatment to the chest before age 37. 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  Risk Assessment   No risk assessment data for the current encounter  Risk Scores       02/06/2022   Last edited by: Jean Aguirre   5-year risk:  1.3%   Lifetime risk: 13%  A: BCCCP exam without pap smear No complaints with benign exam.   P: Referred [facility] to [facility] for a screening mammogram. Appointment scheduled 06/04/2023.  Jean Eleanor LABOR, NP 06/04/2023 10:22 AM

## 2023-06-04 NOTE — Patient Instructions (Signed)
 Taught Jean Aguirre about self breast awareness and gave educational materials to take home. Patient did not need a Pap smear today due to last Pap smear was in 07/06/2019 per patient.  Let her know B[facility]CP will cover Pap smears every 5 years unless has a history of abnormal Pap smears. Referred patient to the [facility] for screening mammogram. Appointment scheduled for 06/04/2023. Patient aware of appointment and will be there. Let patient know will follow up with her within the next couple weeks with results. Jean Aguirre verbalized understanding.  Eleanor DELENA Bach, NP 10:25 AM

## 2023-08-30 ENCOUNTER — Ambulatory Visit: Payer: Self-pay

## 2023-08-30 LAB — GLUCOSE, POCT (MANUAL RESULT ENTRY): POC Glucose: 96 mg/dL (ref 70–99)

## 2023-08-30 NOTE — Congregational Nurse Program (Unsigned)
 Pt was followed up with a  heath interview/assessment upon completing their enrollment into the [employer] Connect Uninsured Program on today (4.7.25)  Pt states she last seen a provider as [facility] 3.11.24.  States re-establishing of care is necessary due needing a provider closer in distance   Chief Reasons Needed for PCP - Wellness check to establish care for management of prediabetes and her reproductive health issues.   Past/Current Medical History (stated by patient)    Socio-determinants health needs identified: Denies SODH needs during visit, but was introduced to onsite food market at [employer] to tour if needed for future access.   Pt did choose to shop for food during today's visit  PHQ-9= 1 (LOW) no behavioral health concerns upon review with patient   Completed During today's Visit -Conducted health screenings for diabetes and hypertension along with basic vital signs during visit   (Both Screenings were WNL)  -Pt completed enrollment into the [employer] Uninsured Program on today (12.9.24) and pt was approved with eligibility thru 12.9.25  -Reviewed "Get Care Now" steps and facilities to utilize when non-emergent vs emergent conditions arise   -Scheduled Appointment for first medical visit scheduled for [facility] on Thursday,  May 06, 2023 @ 11:15am at the [facility]   Plans  -Continous nurse case management upon completion of first medical appointment and thereafter will be maintained with patient while pt is enrolled into Care Connect program -Reviewed "Get Care Now" steps and facilities to utilize when non-emergent vs emergent conditions arise

## 2023-09-02 ENCOUNTER — Encounter: Payer: Self-pay | Admitting: Physician Assistant

## 2023-09-02 ENCOUNTER — Ambulatory Visit: Payer: Self-pay | Admitting: Physician Assistant

## 2023-09-02 VITALS — BP 119/68 | HR 81 | Temp 97.5°F | Ht <= 58 in | Wt 145.0 lb

## 2023-09-02 DIAGNOSIS — Z13220 Encounter for screening for lipoid disorders: Secondary | ICD-10-CM

## 2023-09-02 DIAGNOSIS — Z789 Other specified health status: Secondary | ICD-10-CM

## 2023-09-02 DIAGNOSIS — Z7689 Persons encountering health services in other specified circumstances: Secondary | ICD-10-CM

## 2023-09-02 DIAGNOSIS — Z130 Encounter for screening for diseases of the blood and blood-forming organs and certain disorders involving the immune mechanism: Secondary | ICD-10-CM

## 2023-09-02 DIAGNOSIS — Z131 Encounter for screening for diabetes mellitus: Secondary | ICD-10-CM

## 2023-09-02 DIAGNOSIS — Z862 Personal history of diseases of the blood and blood-forming organs and certain disorders involving the immune mechanism: Secondary | ICD-10-CM

## 2023-09-02 DIAGNOSIS — R7309 Other abnormal glucose: Secondary | ICD-10-CM

## 2023-09-02 DIAGNOSIS — F419 Anxiety disorder, unspecified: Secondary | ICD-10-CM

## 2023-09-02 DIAGNOSIS — Z1211 Encounter for screening for malignant neoplasm of colon: Secondary | ICD-10-CM

## 2023-09-02 MED ORDER — NUVARING 0.12-0.015 MG/24HR VA RING: 1.0000 | VAGINAL_RING | VAGINAL | 1 refills | Status: AC

## 2023-09-02 NOTE — Progress Notes (Signed)
 BP 119/68   Pulse 81   Temp (!) 97.5 F (36.4 C)   Ht 4\' 10"  (1.473 m)   Wt 145 lb (65.8 kg)   SpO2 98%   BMI 30.31 kg/m    Subjective:    Patient ID: Jean Aguirre, female    DOB: 03-25-1976, 48 y.o.   MRN: [id number]  HPI: Jean Aguirre is a 48 y.o. female presenting on 09/02/2023 for New Patient (Initial Visit) (Pt is here to establish care. Previous patient at [facility] pt was last seen there about one year ago. Pt is concerned - she states she feels weak, anxious, without strength. Pt states at times she feels she has little motiviation to do things.  Pt states she is due to refill her nuvaring, but has no refills.) and Arm Pain (Pt states she gets arm pain about once every month. Pain lasts all day pt states it goes away when she takes an aspirin. )   HPI  Chief Complaint  Patient presents with   New Patient (Initial Visit)    Pt is here to establish care. Previous patient at [facility] pt was last seen there about one year ago. Pt is concerned - she states she feels weak, anxious, without strength. Pt states at times she feels she has little motiviation to do things.  Pt states she is due to refill her nuvaring, but has no refills.   Arm Pain    Pt states she gets arm pain about once every month. Pain lasts all day pt states it goes away when she takes an aspirin.     (Problem list indicates mild cognitive impairment)  She does not work  She doesn't know if she feels anxious, antsy or just a lack of strength.   She has a Son who is 8 and daughter is 53   She exercises a little bit with walking in mornings- 3 days/week for an hour.    She sleeps well.  She sometimes feels some depression.   Walking makes her feel better.     Relevant past medical, surgical, family and social history reviewed and updated as indicated. Interim medical history since our last visit reviewed. Allergies and medications reviewed and  updated.    Current Outpatient Medications:    aspirin EC 81 MG tablet, Take 81 mg by mouth daily as needed. Swallow whole., Disp: , Rfl:    Multiple Vitamin (MULTIVITAMIN) capsule, Take 1 capsule by mouth daily., Disp: , Rfl:    NUVARING 0.12-0.015 MG/24HR vaginal ring, Place vaginally., Disp: , Rfl:    valACYclovir (VALTREX) 500 MG tablet, Take 500 mg by mouth daily as needed (for herpes breakout)., Disp: , Rfl:     Review of Systems  Per HPI unless specifically indicated above     Objective:    BP 119/68   Pulse 81   Temp (!) 97.5 F (36.4 C)   Ht 4\' 10"  (1.473 m)   Wt 145 lb (65.8 kg)   SpO2 98%   BMI 30.31 kg/m   Wt Readings from Last 3 Encounters:  09/02/23 145 lb (65.8 kg)  06/04/23 146 lb 13.2 oz (66.6 kg)  02/06/22 145 lb 3.2 oz (65.9 kg)    Physical Exam Vitals reviewed.  Constitutional:      General: She is not in acute distress.    Appearance: She is well-developed. She is not ill-appearing or toxic-appearing.  HENT:     Head: Normocephalic and atraumatic.     Right  Ear: Tympanic membrane, ear canal and external ear normal.     Left Ear: Tympanic membrane, ear canal and external ear normal.  Eyes:     General: No visual field deficit.    Extraocular Movements: Extraocular movements intact.     Conjunctiva/sclera: Conjunctivae normal.     Pupils: Pupils are equal, round, and reactive to light.  Neck:     Thyroid: No thyromegaly.  Cardiovascular:     Rate and Rhythm: Normal rate and regular rhythm.  Pulmonary:     Effort: Pulmonary effort is normal.     Breath sounds: Normal breath sounds.  Abdominal:     General: Bowel sounds are normal.     Palpations: Abdomen is soft. There is no mass.     Tenderness: There is no abdominal tenderness.  Musculoskeletal:     Cervical back: Neck supple.     Right lower leg: No edema.     Left lower leg: No edema.  Lymphadenopathy:     Cervical: No cervical adenopathy.  Skin:    General: Skin is warm and dry.   Neurological:     Mental Status: She is alert and oriented to person, place, and time.     Cranial Nerves: No facial asymmetry.     Sensory: Sensation is intact.     Motor: No weakness or tremor.     Coordination: Romberg sign negative. Finger-Nose-Finger Test normal.     Gait: Gait is intact. Gait normal.     Deep Tendon Reflexes:     Reflex Scores:      Patellar reflexes are 2+ on the right side and 2+ on the left side. Psychiatric:        Behavior: Behavior normal. Behavior is cooperative.     Comments: Pt appears guarded or possibly reticent or maybe just slower with processing.       GAD-7 score 3 PHQ-9 score 3     Assessment & Plan:    Encounter Diagnoses  Name Primary?   Encounter to establish care Yes   Anxious mood    Screening for colon cancer    Screening for cholesterol level    History of anemia    Screening for deficiency anemia    Screening for diabetes mellitus    Elevated glucose level    Not proficient in English language      -will get baseline Labs -pt is given FIT test for colon cancer screening -pt was offered to talk with [facility] but she declined -pt given rx nuvaring per her request.  Discussed other options but she prefers to stay with the nuvaring -PAP and mammogram UTD -pt to follow up 1 month.  She is to contact office sooner prn

## 2023-09-03 ENCOUNTER — Other Ambulatory Visit (HOSPITAL_COMMUNITY)
Admission: RE | Admit: 2023-09-03 | Discharge: 2023-09-03 | Disposition: A | Discharge status: Home / Self Care | Payer: Self-pay | Source: Ambulatory Visit | Attending: Physician Assistant | Admitting: Physician Assistant

## 2023-09-03 DIAGNOSIS — Z862 Personal history of diseases of the blood and blood-forming organs and certain disorders involving the immune mechanism: Secondary | ICD-10-CM | POA: Insufficient documentation

## 2023-09-03 DIAGNOSIS — Z13220 Encounter for screening for lipoid disorders: Secondary | ICD-10-CM | POA: Insufficient documentation

## 2023-09-03 DIAGNOSIS — F419 Anxiety disorder, unspecified: Secondary | ICD-10-CM | POA: Insufficient documentation

## 2023-09-03 DIAGNOSIS — R7309 Other abnormal glucose: Secondary | ICD-10-CM | POA: Insufficient documentation

## 2023-09-03 DIAGNOSIS — Z131 Encounter for screening for diabetes mellitus: Secondary | ICD-10-CM | POA: Insufficient documentation

## 2023-09-03 LAB — HEMOGLOBIN A1C
Hgb A1c MFr Bld: 5.4 % (ref 4.8–5.6)
Mean Plasma Glucose: 108.28 mg/dL

## 2023-09-03 LAB — CBC
HCT: 41.2 % (ref 36.0–46.0)
Hemoglobin: 13.6 g/dL (ref 12.0–15.0)
MCH: 29.2 pg (ref 26.0–34.0)
MCHC: 33 g/dL (ref 30.0–36.0)
MCV: 88.6 fL (ref 80.0–100.0)
Platelets: 236 10*3/uL (ref 150–400)
RBC: 4.65 MIL/uL (ref 3.87–5.11)
RDW: 14.4 % (ref 11.5–15.5)
WBC: 6.8 10*3/uL (ref 4.0–10.5)
nRBC: 0 % (ref 0.0–0.2)

## 2023-09-03 LAB — COMPREHENSIVE METABOLIC PANEL WITH GFR
ALT: 16 U/L (ref 0–44)
AST: 21 U/L (ref 15–41)
Albumin: 3.5 g/dL (ref 3.5–5.0)
Alkaline Phosphatase: 54 U/L (ref 38–126)
Anion gap: 10 (ref 5–15)
BUN: 12 mg/dL (ref 6–20)
CO2: 23 mmol/L (ref 22–32)
Calcium: 8.6 mg/dL — ABNORMAL LOW (ref 8.9–10.3)
Chloride: 104 mmol/L (ref 98–111)
Creatinine, Ser: 0.63 mg/dL (ref 0.44–1.00)
GFR, Estimated: >60 mL/min (ref >60)
Glucose, Bld: 88 mg/dL (ref 70–99)
Potassium: 3.7 mmol/L (ref 3.5–5.1)
Sodium: 137 mmol/L (ref 135–145)
Total Bilirubin: 0.6 mg/dL (ref 0.0–1.2)
Total Protein: 7 g/dL (ref 6.5–8.1)

## 2023-09-03 LAB — LIPID PANEL
Cholesterol: 153 mg/dL (ref 0–200)
HDL: 60 mg/dL (ref >40)
LDL Cholesterol: 65 mg/dL (ref 0–99)
Total CHOL/HDL Ratio: 2.6 ratio
Triglycerides: 139 mg/dL (ref <150)
VLDL: 28 mg/dL (ref 0–40)

## 2023-09-03 LAB — TSH: TSH: 1.338 u[IU]/mL (ref 0.350–4.500)

## 2023-09-06 ENCOUNTER — Other Ambulatory Visit: Payer: Self-pay | Admitting: Physician Assistant

## 2023-09-06 DIAGNOSIS — Z1211 Encounter for screening for malignant neoplasm of colon: Secondary | ICD-10-CM

## 2023-09-06 LAB — POC FIT TEST STOOL: Fecal Occult Blood: NEGATIVE

## 2023-09-30 ENCOUNTER — Encounter: Payer: Self-pay | Admitting: Physician Assistant

## 2023-09-30 ENCOUNTER — Ambulatory Visit: Payer: Self-pay | Admitting: Physician Assistant

## 2023-09-30 VITALS — BP 132/69 | HR 71 | Temp 97.5°F | Ht <= 58 in | Wt 145.0 lb

## 2023-09-30 DIAGNOSIS — B009 Herpesviral infection, unspecified: Secondary | ICD-10-CM

## 2023-09-30 DIAGNOSIS — R21 Rash and other nonspecific skin eruption: Secondary | ICD-10-CM

## 2023-09-30 DIAGNOSIS — Z789 Other specified health status: Secondary | ICD-10-CM

## 2023-09-30 MED ORDER — VALACYCLOVIR HCL 500 MG PO TABS
ORAL_TABLET | ORAL | 11 refills | Status: AC
Start: 2023-09-30 — End: ?

## 2023-09-30 NOTE — Progress Notes (Signed)
 BP 132/69   Pulse 71   Temp (!) 97.5 F (36.4 C)   Ht 4\' 10"  (1.473 m)   Wt 145 lb (65.8 kg)   SpO2 99%   BMI 30.31 kg/m    Subjective:    Patient ID: Jean Aguirre, female    DOB: 1975/12/11, 48 y.o.   MRN: [id number]  HPI: Jean Aguirre is a 48 y.o. female presenting on 09/30/2023 for Follow-up and Rash (Pt states for the last 3 months she has sometimes has some red bumps all over her body which are very itchy. Pt states she is unsure if this is part of herpes or if it is something else. Pt also would like to know if she could get refills for her valacyclovir from here if she needed it.)   HPI  Chief Complaint  Patient presents with   Follow-up   Rash    Pt states for the last 3 months she has sometimes has some red bumps all over her body which are very itchy. Pt states she is unsure if this is part of herpes or if it is something else. Pt also would like to know if she could get refills for her valacyclovir from here if she needed it.      Relevant past medical, surgical, family and social history reviewed and updated as indicated. Interim medical history since our last visit reviewed. Allergies and medications reviewed and updated.  Review of Systems  Per HPI unless specifically indicated above     Objective:     BP 132/69   Pulse 71   Temp (!) 97.5 F (36.4 C)   Ht 4\' 10"  (1.473 m)   Wt 145 lb (65.8 kg)   SpO2 99%   BMI 30.31 kg/m   Wt Readings from Last 3 Encounters:  09/30/23 145 lb (65.8 kg)  09/02/23 145 lb (65.8 kg)  06/04/23 146 lb 13.2 oz (66.6 kg)    Physical Exam Vitals reviewed.  Constitutional:      General: She is not in acute distress.    Appearance: She is well-developed. She is not ill-appearing.  HENT:     Head: Normocephalic and atraumatic.  Cardiovascular:     Rate and Rhythm: Normal rate and regular rhythm.  Pulmonary:     Effort: Pulmonary effort is normal.     Breath sounds: Normal breath sounds.   Musculoskeletal:     Cervical back: Neck supple.     Right lower leg: No edema.     Left lower leg: No edema.  Lymphadenopathy:     Cervical: No cervical adenopathy.  Skin:    General: Skin is warm and dry.     Comments: Three tiny reddish dots on right forearm anterior aspect.  No rash other skin. No vesicles, no erythema, no eccymosis, no fluctuance, no scaling.   Neurological:     Mental Status: She is alert and oriented to person, place, and time.  Psychiatric:        Behavior: Behavior normal.     Results for orders placed or performed in visit on 09/06/23  POC FIT Test   Collection Time: 09/06/23  1:05 PM  Result Value Ref Range   Fecal Occult Blood Negative       Assessment & Plan:    Encounter Diagnoses  Name Primary?   Rash Yes   HSV infection    Not proficient in [place] language      -reviewed labs with pt  rash Pt was  counseled that her inermittent wandering rash may be due to sensitive skin and she is encouraged to change to products like laundry detergent and moisturizers without fragrance or dye.  She can use OTC cortisone cream prn itching  HSV Refill valtrex sent per pt request  HCM PAP due feb 2026 Mammogram due Jan 2026 Colorectal cancer screening due April 2026  Pt to follow up for annual exam and PAP in one year. She is to contact office sooner prn

## 2023-12-07 ENCOUNTER — Encounter: Payer: Self-pay | Admitting: Physician Assistant

## 2023-12-07 ENCOUNTER — Ambulatory Visit: Payer: Self-pay | Admitting: Physician Assistant

## 2023-12-07 VITALS — BP 132/88 | HR 84 | Temp 98.1°F | Ht <= 58 in

## 2023-12-07 DIAGNOSIS — L309 Dermatitis, unspecified: Secondary | ICD-10-CM

## 2023-12-07 DIAGNOSIS — Z789 Other specified health status: Secondary | ICD-10-CM

## 2023-12-07 MED ORDER — HYDROCORTISONE 2.5 % EX OINT: TOPICAL_OINTMENT | CUTANEOUS | 1 refills | Status: AC

## 2023-12-07 NOTE — Progress Notes (Signed)
   BP 132/88   Pulse 84   Temp 98.1 F (36.7 C)   Ht 4' 10 (1.473 m)   SpO2 98%   BMI 30.31 kg/m    Subjective:    Patient ID: Jean Aguirre, female    DOB: 05-31-1975, 48 y.o.   MRN: [id number]  HPI: Jean Aguirre is a 48 y.o. female presenting on 12/07/2023 for Rash (On face. Pt states she had dealt with the rash for the past 5 years off and on, but had only been on her scalp. Pt states for the past month the rash has now been present on her face. Pt states her face has been dry, red and itchy.  Pt states right now it is not so bad, but has had it all over her hairline, forehead, behind her ears, and creases of her nose.  Pt uses hydrocortisone  which does not help, she has used vaseline, and aloevera which only hydrates for the moment.)   HPI  Relevant past medical, surgical, family and social history reviewed and updated as indicated. Interim medical history since our last visit reviewed. Allergies and medications reviewed and updated.  Review of Systems  Per HPI unless specifically indicated above     Objective:    BP 132/88   Pulse 84   Temp 98.1 F (36.7 C)   Ht 4' 10 (1.473 m)   SpO2 98%   BMI 30.31 kg/m   Wt Readings from Last 3 Encounters:  09/30/23 145 lb (65.8 kg)  09/02/23 145 lb (65.8 kg)  06/04/23 146 lb 13.2 oz (66.6 kg)    Physical Exam Constitutional:      General: She is not in acute distress.    Appearance: She is not toxic-appearing.  HENT:     Head: Normocephalic and atraumatic.  Skin:    Findings: Rash present.     Comments: Rash as pictured.  No areas seen in scalp.  Neurological:     Mental Status: She is oriented to person, place, and time.  Psychiatric:        Behavior: Behavior normal.             Assessment & Plan:   Encounter Diagnoses  Name Primary?   Eczema, unspecified type Yes   Not proficient in English language       -Steroid ointment  -She can use t-gel prn to shampoo -pt to  follow up as scheduled.  She is to RTO if this doesn't help.

## 2023-12-07 NOTE — Patient Instructions (Addendum)
T-gel shampoo

## 2024-06-07 ENCOUNTER — Emergency Department (HOSPITAL_COMMUNITY): Payer: Self-pay

## 2024-06-07 ENCOUNTER — Other Ambulatory Visit: Payer: Self-pay

## 2024-06-07 ENCOUNTER — Encounter (HOSPITAL_COMMUNITY): Payer: Self-pay

## 2024-06-07 ENCOUNTER — Emergency Department (HOSPITAL_COMMUNITY)
Admission: EM | Admit: 2024-06-07 | Discharge: 2024-06-07 | Disposition: A | Discharge status: Home / Self Care | Payer: Self-pay | Attending: Emergency Medicine | Admitting: Emergency Medicine

## 2024-06-07 DIAGNOSIS — N132 Hydronephrosis with renal and ureteral calculous obstruction: Secondary | ICD-10-CM | POA: Insufficient documentation

## 2024-06-07 DIAGNOSIS — N23 Unspecified renal colic: Secondary | ICD-10-CM

## 2024-06-07 LAB — CBC
HCT: 43.4 % (ref 36.0–46.0)
Hemoglobin: 14.5 g/dL (ref 12.0–15.0)
MCH: 29.7 pg (ref 26.0–34.0)
MCHC: 33.4 g/dL (ref 30.0–36.0)
MCV: 88.8 fL (ref 80.0–100.0)
Platelets: 270 K/uL (ref 150–400)
RBC: 4.89 MIL/uL (ref 3.87–5.11)
RDW: 12.9 % (ref 11.5–15.5)
WBC: 12.4 K/uL — ABNORMAL HIGH (ref 4.0–10.5)
nRBC: 0 % (ref 0.0–0.2)

## 2024-06-07 LAB — COMPREHENSIVE METABOLIC PANEL WITH GFR
ALT: 13 U/L (ref 0–44)
AST: 27 U/L (ref 15–41)
Albumin: 4.3 g/dL (ref 3.5–5.0)
Alkaline Phosphatase: 69 U/L (ref 38–126)
Anion gap: 15 (ref 5–15)
BUN: 13 mg/dL (ref 6–20)
CO2: 19 mmol/L — ABNORMAL LOW (ref 22–32)
Calcium: 9.1 mg/dL (ref 8.9–10.3)
Chloride: 102 mmol/L (ref 98–111)
Creatinine, Ser: 0.86 mg/dL (ref 0.44–1.00)
GFR, Estimated: >60 mL/min
Glucose, Bld: 100 mg/dL — ABNORMAL HIGH (ref 70–99)
Potassium: 3.7 mmol/L (ref 3.5–5.1)
Sodium: 136 mmol/L (ref 135–145)
Total Bilirubin: 0.4 mg/dL (ref 0.0–1.2)
Total Protein: 7.4 g/dL (ref 6.5–8.1)

## 2024-06-07 LAB — URINALYSIS, ROUTINE W REFLEX MICROSCOPIC
Bilirubin Urine: NEGATIVE
Glucose, UA: NEGATIVE mg/dL
Ketones, ur: 20 mg/dL — AB
Nitrite: NEGATIVE
Protein, ur: NEGATIVE mg/dL
Specific Gravity, Urine: 1.009 (ref 1.005–1.030)
pH: 6 (ref 5.0–8.0)

## 2024-06-07 LAB — LIPASE, BLOOD: Lipase: 22 U/L (ref 11–51)

## 2024-06-07 MED ORDER — ONDANSETRON HCL 4 MG/2ML IJ SOLN
4.0000 mg | Freq: Once | INTRAMUSCULAR | Status: AC
  Administered 2024-06-07: 4 mg via INTRAVENOUS
  Filled 2024-06-07: qty 2

## 2024-06-07 MED ORDER — KETOROLAC TROMETHAMINE 15 MG/ML IJ SOLN
15.0000 mg | Freq: Once | INTRAMUSCULAR | Status: AC
  Administered 2024-06-07: 15 mg via INTRAVENOUS
  Filled 2024-06-07: qty 1

## 2024-06-07 MED ORDER — ONDANSETRON 4 MG PO TBDP: 4.0000 mg | ORAL_TABLET | Freq: Three times a day (TID) | ORAL | 0 refills | Status: DC | PRN

## 2024-06-07 MED ORDER — IBUPROFEN 600 MG PO TABS: 600.0000 mg | ORAL_TABLET | Freq: Four times a day (QID) | ORAL | 0 refills | Status: AC | PRN

## 2024-06-07 MED ORDER — TAMSULOSIN HCL 0.4 MG PO CAPS: 0.4000 mg | ORAL_CAPSULE | Freq: Every day | ORAL | 0 refills | Status: DC

## 2024-06-07 MED ORDER — OXYCODONE-ACETAMINOPHEN 5-325 MG PO TABS
1.0000 | ORAL_TABLET | Freq: Once | ORAL | Status: AC
  Administered 2024-06-07: 1 via ORAL
  Filled 2024-06-07: qty 1

## 2024-06-07 MED ORDER — LACTATED RINGERS IV BOLUS
1000.0000 mL | Freq: Once | INTRAVENOUS | Status: AC
  Administered 2024-06-07: 1000 mL via INTRAVENOUS

## 2024-06-07 MED ORDER — ONDANSETRON 4 MG PO TBDP
4.0000 mg | ORAL_TABLET | Freq: Once | ORAL | Status: AC | PRN
  Administered 2024-06-07: 4 mg via ORAL
  Filled 2024-06-07: qty 1

## 2024-06-07 MED ORDER — OXYCODONE HCL 5 MG PO TABS: 5.0000 mg | ORAL_TABLET | Freq: Four times a day (QID) | ORAL | 0 refills | Status: DC | PRN

## 2024-06-07 NOTE — ED Triage Notes (Signed)
 Pt coming in with lower back pain beginning around 1pm and is has been vomiting frequently since. Pain came on suddenly per patient. Pt reports sweeping when the pain started. Pt able to ambulate independently with steady gait to triage. Pt reports pain feels like spasms. Pt also reports vomiting is only occurring when pain spikes. Pt denies any urinary symptoms, denies fever.

## 2024-06-07 NOTE — ED Notes (Signed)
 Discharge instructions reviewed with patient. Patient questions answered and opportunity for education reviewed. Patient voices understanding of discharge instructions with no further questions. Patient ambulatory with steady gait to lobby.

## 2024-06-07 NOTE — Discharge Instructions (Addendum)
 Le hemos evaluado por su dolor de espalda. Los resultados de las pruebas muestran que tiene un clculo renal de 6 mm. Los clculos renales de este tamao suelen eliminarse solos, aunque no siempre. Le recomendamos que consulte con engineer, manufacturing systems. Por favor, pida una cita con el Dr. Sherrilee.  Para aliviar sus sntomas en casa, puede tomar Tylenol  (acetaminofn) y Motrin  (ibuprofeno). Puede tomar 1000 mg de Tylenol  cada 6 horas y 600 mg de Motrin  cada 6 horas, segn sea necesario. Puede tomar [employer] juntos, ya sea al mismo tiempo o alternndolos cada 3 horas.  Le hemos recetado una pequea cantidad de oxicodona, que puede tomar para el dolor que no se alivia con Tylenol  y Motrin . No consuma alcohol, no conduzca ni opere maquinaria mientras toma este medicamento.  Tambin le hemos recetado un medicamento llamado Flomax . Tmelo diariamente, ya que ayudar a la expulsin del clculo renal.  Si presenta nuevos sntomas como dolor incontrolable, vmitos incontrolables, fiebre, dolor al orinar, [employer] [place], acuda al servicio de [place].  We evaluated you for your back pain.  Your testing shows that you have a 6 mm kidney stone.  This size kidney stone often, but not always passes on its own.  We would like you to follow-up with a urologist.  Please follow-up with Dr. Sherrilee.  Please take Tylenol  (acetaminophen ) and Motrin  (ibuprofen ) for your symptoms at home.  You can take 1000 mg of Tylenol  every 6 hours and 600 mg of Motrin  every 6 hours as needed for your symptoms.  You can take these medicines together as needed, either at the same time, or alternating every 3 hours.  We have given you a small amount of oxycodone  which you can take for pain not relieved by Tylenol  and Motrin .  Do not drink alcohol, drive, or operate machinery when taking this medicine.  We have also prescribed you a medication called Flomax .  Please take this daily as it will help the kidney stone pass.  If you have  new symptoms such as uncontrolled pain, uncontrolled vomiting, fevers, painful urination, lightheadedness or dizziness, or fainting please return to the emergency department.
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
## 2024-06-20 ENCOUNTER — Encounter: Payer: Self-pay | Admitting: Physician Assistant

## 2024-06-20 ENCOUNTER — Ambulatory Visit: Payer: Self-pay | Admitting: Physician Assistant

## 2024-06-20 VITALS — BP 130/73 | HR 80 | Temp 97.8°F

## 2024-06-20 DIAGNOSIS — Z789 Other specified health status: Secondary | ICD-10-CM

## 2024-06-20 DIAGNOSIS — N23 Unspecified renal colic: Secondary | ICD-10-CM

## 2024-06-20 DIAGNOSIS — N200 Calculus of kidney: Secondary | ICD-10-CM

## 2024-06-20 DIAGNOSIS — N1330 Unspecified hydronephrosis: Secondary | ICD-10-CM

## 2024-06-20 NOTE — Progress Notes (Signed)
 "  BP 130/73   Pulse 80   Temp 97.8 F (36.6 C)   SpO2 98%    Subjective:    Patient ID: Jean Aguirre, female    DOB: May 01, 1976, 49 y.o.   MRN: [id number]  HPI: Jean Aguirre is a 49 y.o. female presenting on 06/20/2024 for Follow-up (From ER)   HPI   Pt went to ER on 06/07/24 and was diagnosed with renal colic and kidney stone.    She is taking flomax  and IBU for the pain which she rates as a 5/10.   The pain comes and goes.    ER didn't call urology yet as recommended and didn't call this office until this morning.    Relevant past medical, surgical, family and social history reviewed and updated as indicated. Interim medical history since our last visit reviewed. Allergies and medications reviewed and updated.   Current Outpatient Medications:    Multiple Vitamin (MULTIVITAMIN) capsule, Take 1 capsule by mouth daily., Disp: , Rfl:    NUVARING  0.12-0.015 MG/24HR vaginal ring, Place 1 each vaginally every 28 (twenty-eight) days. Remove it after 21 days and have a 7 day without ring before inserting a new one.   Coloque una vaginalmente cada [address]  Retrelo despus de [address] y [employer] 7 das sin anillo antes de insertar uno nuevo., Disp: 3 each, Rfl: 1   oxyCODONE  (ROXICODONE ) 5 MG immediate release tablet, Take 1 tablet (5 mg total) by mouth every 6 (six) hours as needed for severe pain (pain score 7-10)., Disp: 12 tablet, Rfl: 0   tamsulosin  (FLOMAX ) 0.4 MG CAPS capsule, Take 1 capsule (0.4 mg total) by mouth daily after supper., Disp: 30 capsule, Rfl: 0   aspirin EC 81 MG tablet, Take 81 mg by mouth daily as needed. Swallow whole., Disp: , Rfl:    hydrocortisone  2.5 % ointment, Apply sparingly to affected area bid prn.  Aplique escasamente a la area afectafa dis veces al dia cuando sea necesario., Disp: 20 g, Rfl: 1   ibuprofen  (ADVIL ) 600 MG tablet, Take 1 tablet (600 mg total) by mouth every 6 (six) hours as needed., Disp: 30 tablet, Rfl: 0    ondansetron  (ZOFRAN -ODT) 4 MG disintegrating tablet, Take 1 tablet (4 mg total) by mouth every 8 (eight) hours as needed for nausea or vomiting., Disp: 20 tablet, Rfl: 0   valACYclovir  (VALTREX ) 500 MG tablet, 1po bid x 3 days prn herpes outbreak.  1 tableta por boca dos veces al dia por 3 dias cuando sea necesario para alboroto de herpes, Disp: 6 tablet, Rfl: 11    Review of Systems  Per HPI unless specifically indicated above     Objective:    BP 130/73   Pulse 80   Temp 97.8 F (36.6 C)   SpO2 98%   Wt Readings from Last 3 Encounters:  06/07/24 145 lb (65.8 kg)  09/30/23 145 lb (65.8 kg)  09/02/23 145 lb (65.8 kg)    Physical Exam Vitals reviewed.  Constitutional:      General: She is not in acute distress.    Appearance: She is well-developed. She is not toxic-appearing.  HENT:     Head: Normocephalic and atraumatic.  Cardiovascular:     Rate and Rhythm: Normal rate and regular rhythm.  Pulmonary:     Effort: Pulmonary effort is normal.     Breath sounds: Normal breath sounds.  Abdominal:     General: Bowel sounds are normal.     Palpations: Abdomen  is soft. There is no mass.     Tenderness: There is no abdominal tenderness. There is no right CVA tenderness, left CVA tenderness, guarding or rebound.  Musculoskeletal:     Cervical back: Neck supple.     Right lower leg: No edema.     Left lower leg: No edema.  Lymphadenopathy:     Cervical: No cervical adenopathy.  Skin:    General: Skin is warm and dry.  Neurological:     Mental Status: She is alert and oriented to person, place, and time.  Psychiatric:        Behavior: Behavior normal.           Assessment & Plan:    Encounter Diagnoses  Name Primary?   Renal colic Yes   Calculus of kidney    Hydronephrosis, unspecified hydronephrosis type    Not proficient in English language      -records from ER visit on 06/07/24 were reviewed -referral placed to urology -pt given application for [employer] financial assistance -pt to continue flomax  and IBU  "

## 2024-06-21 ENCOUNTER — Ambulatory Visit (HOSPITAL_COMMUNITY)
Admission: RE | Admit: 2024-06-21 | Discharge: 2024-06-21 | Disposition: A | Discharge status: Home / Self Care | Payer: Self-pay | Source: Ambulatory Visit | Attending: Urology | Admitting: Urology

## 2024-06-21 ENCOUNTER — Encounter: Payer: Self-pay | Admitting: Urology

## 2024-06-21 ENCOUNTER — Ambulatory Visit: Payer: Self-pay | Admitting: Urology

## 2024-06-21 VITALS — BP 112/71 | HR 78

## 2024-06-21 DIAGNOSIS — N200 Calculus of kidney: Secondary | ICD-10-CM | POA: Insufficient documentation

## 2024-06-21 LAB — MICROSCOPIC EXAMINATION

## 2024-06-21 LAB — URINALYSIS, ROUTINE W REFLEX MICROSCOPIC
Bilirubin, UA: NEGATIVE
Glucose, UA: NEGATIVE
Ketones, UA: NEGATIVE
Nitrite, UA: NEGATIVE
Protein,UA: NEGATIVE
Specific Gravity, UA: <1.005 — ABNORMAL LOW (ref 1.005–1.030)
Urobilinogen, Ur: 0.2 mg/dL (ref 0.2–1.0)
pH, UA: 6 (ref 5.0–7.5)

## 2024-06-21 MED ORDER — TAMSULOSIN HCL 0.4 MG PO CAPS: 0.4000 mg | ORAL_CAPSULE | Freq: Every day | ORAL | 0 refills | Status: AC

## 2024-06-21 MED ORDER — OXYCODONE HCL 5 MG PO TABS: 5.0000 mg | ORAL_TABLET | Freq: Four times a day (QID) | ORAL | 0 refills | Status: AC | PRN

## 2024-06-21 MED ORDER — ONDANSETRON 4 MG PO TBDP: 4.0000 mg | ORAL_TABLET | Freq: Three times a day (TID) | ORAL | 0 refills | Status: AC | PRN

## 2024-06-21 NOTE — Progress Notes (Unsigned)
 "  06/21/2024 12:08 PM   Jean Aguirre [birth year] [id number]  Referring provider: Comer Kirsch, PA-C [address] [place],  [place] [address]  nephrolithiasis   HPI: Jean Aguirre is a 48yo here for evaluation of nephrolithiasis. She presented to the ER 06/07/24 and was diagnosed with a 6mm left UPJ calculus. She has not passed her calculus. She denies any LUTS. She last had left flank pain yesterday. This is her first stone event. Her brother has a history of nephrolithiasis.    PMH: Past Medical History:  Diagnosis Date   AMA (advanced maternal age) multigravida 35+    Cervical dysplasia    Genital herpes     Surgical History: Past Surgical History:  Procedure Laterality Date   CESAREAN SECTION  2010   breech presentation   CESAREAN SECTION N/A 03/05/2015   Procedure: CESAREAN SECTION;  Surgeon: Bebe Furry, MD;  Location: [facility] ORS;  Service: Obstetrics;  Laterality: N/A;   LAPAROSCOPIC APPENDECTOMY N/A 07/19/2021   Procedure: APPENDECTOMY LAPAROSCOPIC;  Surgeon: Kallie Manuelita BROCKS, MD;  Location: [facility];  Service: General;  Laterality: N/A;   LEEP     CIN II-III    Home Medications:  Allergies as of 06/21/2024       Reactions   Pineapple Itching   Throat itches        Medication List        Accurate as of June 21, 2024 12:08 PM. If you have any questions, ask your nurse or doctor.          aspirin EC 81 MG tablet Take 81 mg by mouth daily as needed. Swallow whole.   hydrocortisone  2.5 % ointment Apply sparingly to affected area bid prn.  Aplique escasamente a la area afectafa dis veces al dia cuando sea necesario.   ibuprofen  600 MG tablet Commonly known as: ADVIL  Take 1 tablet (600 mg total) by mouth every 6 (six) hours as needed.   multivitamin capsule Take 1 capsule by mouth daily.   NuvaRing  0.12-0.015 MG/24HR vaginal ring Generic drug: etonogestrel-ethinyl estradiol Place 1 each vaginally every 28 (twenty-eight)  days. Remove it after 21 days and have a 7 day without ring before inserting a new one.   Coloque una vaginalmente cada [address]  Retrelo despus de [address] y [employer] 7 das sin anillo antes de insertar uno nuevo.   ondansetron  4 MG disintegrating tablet Commonly known as: ZOFRAN -ODT Take 1 tablet (4 mg total) by mouth every 8 (eight) hours as needed for nausea or vomiting.   oxyCODONE  5 MG immediate release tablet Commonly known as: Roxicodone  Take 1 tablet (5 mg total) by mouth every 6 (six) hours as needed for severe pain (pain score 7-10).   tamsulosin  0.4 MG Caps capsule Commonly known as: FLOMAX  Take 1 capsule (0.4 mg total) by mouth daily after supper.   valACYclovir  500 MG tablet Commonly known as: VALTREX  1po bid x 3 days prn herpes outbreak.  1 tableta por boca dos veces al dia por 3 dias cuando sea necesario para alboroto de herpes        Allergies: Allergies[1]  Family History: Family History  Problem Relation Age of Onset   Diabetes Mother    Hypertension Mother    Diabetes Father    Breast cancer Sister        deceased from CA at 37y/o    Social History:  reports that she has never smoked. She has never used smokeless tobacco. She reports that she does not  drink alcohol and does not use drugs.  ROS: All other review of systems were reviewed and are negative except what is noted above in HPI  Physical Exam: BP 112/71   Pulse 78   Constitutional:  Alert and oriented, No acute distress. HEENT: [place] AT, moist mucus membranes.  Trachea midline, no masses. Cardiovascular: No clubbing, cyanosis, or edema. Respiratory: Normal respiratory effort, no increased work of breathing. GI: Abdomen is soft, nontender, nondistended, no abdominal masses GU: No CVA tenderness.  Lymph: No cervical or inguinal lymphadenopathy. Skin: No rashes, bruises or suspicious lesions. Neurologic: Grossly intact, no focal deficits, moving all 4 extremities. Psychiatric: Normal mood  and affect.  Laboratory Data: Lab Results  Component Value Date   WBC 12.4 (H) 06/07/2024   HGB 14.5 06/07/2024   HCT 43.4 06/07/2024   MCV 88.8 06/07/2024   PLT 270 06/07/2024    Lab Results  Component Value Date   CREATININE 0.86 06/07/2024    No results found for: PSA  No results found for: TESTOSTERONE  Lab Results  Component Value Date   HGBA1C 5.4 09/03/2023    Urinalysis    Component Value Date/Time   COLORURINE YELLOW 06/07/2024 2057   APPEARANCEUR HAZY (A) 06/07/2024 2057   LABSPEC 1.009 06/07/2024 2057   PHURINE 6.0 06/07/2024 2057   GLUCOSEU NEGATIVE 06/07/2024 2057   HGBUR MODERATE (A) 06/07/2024 2057   BILIRUBINUR NEGATIVE 06/07/2024 2057   KETONESUR 20 (A) 06/07/2024 2057   PROTEINUR NEGATIVE 06/07/2024 2057   UROBILINOGEN 0.2 03/07/2008 2145   NITRITE NEGATIVE 06/07/2024 2057   LEUKOCYTESUR TRACE (A) 06/07/2024 2057    Lab Results  Component Value Date   BACTERIA RARE (A) 06/07/2024    Pertinent Imaging: Ct 06/07/2024 and [facility] today: Images reviewed and discussed with the patient  No results found for this or any previous visit.  No results found for this or any previous visit.  No results found for this or any previous visit.  No results found for this or any previous visit.  No results found for this or any previous visit.  No results found for this or any previous visit.  No results found for this or any previous visit.  Results for orders placed during the hospital encounter of 06/07/24  CT Renal Stone Study  Narrative EXAM: CT ABDOMEN AND PELVIS WITHOUT CONTRAST 06/07/2024 10:10:32 PM  TECHNIQUE: CT of the abdomen and pelvis was performed without the administration of intravenous contrast. Multiplanar reformatted images are provided for review. Automated exposure control, iterative reconstruction, and/or weight-based adjustment of the mA/kV was utilized to reduce the radiation dose to as low as reasonably  achievable.  COMPARISON: 07/19/2021.  CLINICAL HISTORY: Back pain.  FINDINGS:  LOWER CHEST: No acute abnormality.  LIVER: The liver is unremarkable.  GALLBLADDER AND BILE DUCTS: Gallbladder is unremarkable. No biliary ductal dilatation.  SPLEEN: No acute abnormality.  PANCREAS: No acute abnormality.  ADRENAL GLANDS: No acute abnormality.  KIDNEYS, URETERS AND BLADDER: A 6 mm proximal left ureteral stone is noted, causing moderate hydronephrosis on the left. The distal left ureter is within normal limits. No stones in the right kidney or ureter. No right hydronephrosis. The bladder is unremarkable.  GI AND BOWEL: Stomach demonstrates no acute abnormality. The appendix has been surgically removed. No obstructive changes of the colon are noted. There is no bowel obstruction.  PERITONEUM AND RETROPERITONEUM: No ascites. No free air.  VASCULATURE: Aorta is normal in caliber.  LYMPH NODES: No lymphadenopathy.  REPRODUCTIVE ORGANS: The uterus  is within normal limits.  BONES AND SOFT TISSUES: No acute osseous abnormality. No focal soft tissue abnormality.  IMPRESSION: 1. 6 mm proximal left ureteral stone causing moderate left hydronephrosis.  Electronically signed by: Oneil Devonshire MD 06/07/2024 10:20 PM EST RP Workstation: [id number]   Assessment & Plan:    1. Kidney stone (Primary) -We discussed the management of kidney stones. These options include observation, ureteroscopy, shockwave lithotripsy (ESWL) and percutaneous nephrolithotomy (PCNL). We discussed which options are relevant to the patient's stone(s). We discussed the natural history of kidney stones as well as the complications of untreated stones and the impact on quality of life without treatment as well as with each of the above listed treatments. We also discussed the efficacy of each treatment in its ability to clear the stone burden. With any of these management options I discussed the signs and  symptoms of infection and the need for emergent treatment should these be experienced. For each option we discussed the ability of each procedure to clear the patient of their stone burden.   For observation I described the risks which include but are not limited to silent renal damage, life-threatening infection, need for emergent surgery, failure to pass stone and pain.   For ureteroscopy I described the risks which include bleeding, infection, damage to contiguous structures, positioning injury, ureteral stricture, ureteral avulsion, ureteral injury, need for prolonged ureteral stent, inability to perform ureteroscopy, need for an interval procedure, inability to clear stone burden, stent discomfort/pain, heart attack, stroke, pulmonary embolus and the inherent risks with general anesthesia.   For shockwave lithotripsy I described the risks which include arrhythmia, kidney contusion, kidney hemorrhage, need for transfusion, pain, inability to adequately break up stone, inability to pass stone fragments, Steinstrasse, infection associated with obstructing stones, need for alternate surgical procedure, need for repeat shockwave lithotripsy, MI, CVA, PE and the inherent risks with anesthesia/conscious sedation.   For PCNL I described the risks including positioning injury, pneumothorax, hydrothorax, need for chest tube, inability to clear stone burden, renal laceration, arterial venous fistula or malformation, need for embolization of kidney, loss of kidney or renal function, need for repeat procedure, need for prolonged nephrostomy tube, ureteral avulsion, MI, CVA, PE and the inherent risks of general anesthesia.   - The patient would like to proceed with medical expulsive therapy. Followup 2 weeks with [facility]  - Urinalysis, Routine w reflex microscopic - DG Abd 1 View   No follow-ups on file.  Belvie Clara, MD  [facility] [place]       [1]  Allergies Allergen Reactions    Pineapple Itching    Throat itches   "

## 2024-06-21 NOTE — Patient Instructions (Signed)
Recomendaciones dietticas para prevenir la formacin de clculos renales Dietary Guidelines to Help Prevent Kidney Stones Los clculos renales son depsitos de minerales y sales que se forman en el interior de los riones. Su riesgo de tener clculos renales puede ser mayor en funcin de su dieta, el estilo de vida, los medicamentos que toma y la presencia de ciertas afecciones mdicas. La mayora de las personas pueden reducir el riesgo de desarrollar clculos renales siguiendo estas pautas alimentarias. Su nutricionista puede darle indicaciones ms especficas segn su estado de salud general y el tipo de clculos renales que tienda a desarrollar. Consejos para seguir este plan Al leer las etiquetas de los alimentos  Elija alimentos con etiquetas que digan "sin sal agregada" o "bajo contenido de sal". Limite el consumo de sal (sodio) a menos de 1,500 mg por da. Elija alimentos con calcio para incluirlos en cada comida o refrigerio. Trate de incorporar 300 mg de calcio en cada comida. Entre los alimentos que contienen de 200 a 500 mg de calcio por porcin se incluyen los siguientes: 8 onzas (237 ml) de leche, leche no lcteafortificada con calcio y jugo de frutafortificado con calcio. Que una bebida est fortificada con calcio significa que se le ha aadido calcio. 8 onzas (237 ml) de kfir, yogur y yogur de soja. 4 onzas (114 ml) de tofu. 1 onza (28 g) de queso. 1 taza (150 g) de higos secos. 1 taza (91 g) de brcoli cocido. Una lata de 3 onzas (85 g) de sardinas o caballa. La mayora de las personas necesitan de 1,000 a 1,500 mg de calcio por da. Hable con su nutricionista sobre la cantidad de calcio que recomendara para usted. Al ir de compras Compre mucha fruta y verdura fresca. La mayora de las personas no necesitan dejar de consumir frutas y verduras, aun cuando contengan nutrientes que puedan contribuir a formar clculos renales. Cuando compre alimentos semipreparados, elija los  siguientes: Frutas enteras. Ensaladas preparadas con aderezo aparte. Batidos de fruta y yogur descremados. Evite comprar comidas congeladas o fiambres. Estos pueden tener un alto contenido de sodio. Busque alimentos con cultivos vivos, como yogur y kfir. Elija cereales con alto contenido de fibras, como panes integrales, salvado de avena y cereales de trigo integral. Al cocinar No agregue sal a los alimentos cuando cocine. Ponga un salero en la mesa y deje que cada persona agregue sal a su gusto. Para las pastas, guisos y sopas, use protenas vegetales, como frijoles, protena vegetal texturada o tofu, en lugar de carnes. Planificacin de las comidas Si su nutricionista se lo indica, ingiera menos sal. Para hacer esto: Evite consumir alimentos procesados o preelaborados. Evite las comidas rpidas. Coma menos protenas animales, como queso, carne de vaca, ave o pescado, si as se lo indica su nutricionista. Para hacer esto: Limite la cantidad de veces que ingiere carne de vaca, ave, pescado, o queso por semana. Siga una dieta sin carne vacuna, al menos 2 das por semana. Coma solo una porcin por da de carne de vaca, ave, pescado o mariscos. Cuando prepare protenas animales, corte los trozos en porciones pequeas. En trminos generales, una porcin de carne vacuna o de pescado tiene el tamao aproximado de la palma de la mano. Coma al menos cinco porciones de frutas y verduras frescas por da. Para hacer esto: Tenga a mano frutas y verduras para los refrigerios. Coma una fruta o un puado de frutos rojos con el desayuno. Coma una ensalada y fruta en el almuerzo. Incorpore dos clases de   verduras en la cena. Es posible que le hayan indicado que limite los alimentos con alto contenido de una sustancia llamada oxalato. Estos incluyen: Espinaca (cocida), ruibarbo, remolachas, batatas y acelga. Manes. Papas fritas de bolsa, papas fritas y papas al horno con piel. Frutos secos y productos con  frutos secos. Chocolate. Si toma diurticos regularmente, asegrese de comer al menos 1 o 2 porciones de frutas o verduras con alto contenido de potasio todos los das. Estos incluyen: [place]. Banana. Naranja, ciruela pasa, zanahoria, o jugo de tomate. Patata al horno. Repollo. Frijoles y arvejas partidas. Estilo de vida  Beba suficiente lquido como para mantener la orina de color amarillo plido. Esto es lo ms importante que puede hacer. Distribuya la ingesta de lquidos durante el da. Si bebe alcohol: Limite la cantidad que bebe a lo siguiente: De 0 a 1 medida por da para las mujeres que no estn embarazadas. De 0 a 2 medidas por da para los hombres. Sepa cunta cantidad de alcohol hay en las bebidas que toma. En los [place], una medida equivale a una botella de cerveza de 12 oz (355 ml), un vaso de vino de 5 oz (148 ml) o un vaso de una bebida alcohlica de alta graduacin de 1 oz (44 ml). Si su mdico se lo indica, baje de peso. Trabaje con su nutricionista para encontrar el plan de alimentacin y las estrategias para bajar de peso que mejor funcionen para usted. Informacin general Hable con su mdico y su nutricionista sobre tomar suplementos diarios. En funcin de su salud y de la causa de sus clculos renales, usted puede recibir las siguientes recomendaciones: No tomar suplementos de vitamina C en dosis altas (1000 mg al da o ms). Tomar un suplemento de calcio. Tomar un suplemento probitico diario. Tomar otros suplementos como magnesio, aceite de pescado o vitamina B6. Use los medicamentos de venta libre y los recetados solamente como se lo haya indicado el mdico. Estos incluyen suplementos. Qu alimentos debo limitar? Limite la ingesta de los siguientes alimentos, o cmalos segn se lo indique su nutricionista. Verduras Espinaca. Ruibarbo. Remolachas. Verduras enlatadas. Pepinillos. Aceitunas. Papas al horno con piel. Granos Salvado de trigo. Panificados.  Galletas saladas. Cereales con alto contenido de azcar. Carnes y otras protenas Frutos secos. Mantequilla de frutos secos. Porciones grandes de carne vacuna, de ave o de pescado. Carnes saladas, precocidas o curadas, como salchichas, panes de carne y hot dogs. Lcteos Quesos. Bebidas Refrescos regulares. Jugo de verduras regular. Alios y condimentos Mezclas de condimentos con sal. Condimentos para ensalada. Salsa de soja. Ktchup. Salsa barbacoa. Otros alimentos Sopas enlatadas. Salsa en lata para pastas. Guisos. Pizza. Lasaa. Comidas congeladas. Papas fritas en bolsa. Papas fritas. Es posible que los productos que se enumeran ms arriba no constituyan una lista completa de los alimentos y las bebidas que debe limitar. Consulte a un nutricionista para obtener ms informacin. Qu alimentos debo evitar? Hable con su nutricionista sobre los alimentos especficos que debera evitar de acuerdo con el tipo de clculos renales que tiene y su estado de salud general. Frutas Pomelo. Es posible que los productos que se enumeran ms arriba no constituyan una lista completa de los alimentos y las bebidas que debe evitar. Consulte a un nutricionista para obtener ms informacin. Resumen Los clculos renales son depsitos de minerales y sales que se forman en el interior de los riones. Puede reducir el riesgo de tener clculos renales si introduce cambios en su alimentacin. Lo ms importante es que beba mucho lquido. Beba   suficiente lquido como para Pharmacologist la orina de color amarillo plido. Hable con su nutricionista sobre la cantidad de calcio que debe consumir por da y consuma menos sal y protenas animales como se lo haya indicado el nutricionista. Esta informacin no tiene Theme park manager el consejo del mdico. Asegrese de hacerle al mdico cualquier pregunta que tenga. Document Revised: 09/16/2021 Document Reviewed: 09/16/2021 Elsevier Patient Education  2024 [employer].

## 2024-07-03 ENCOUNTER — Ambulatory Visit: Payer: Self-pay | Admitting: Urology

## 2024-08-31 ENCOUNTER — Ambulatory Visit: Payer: Self-pay | Admitting: Physician Assistant
